# Patient Record
Sex: Female | Born: 1965 | Race: White | Hispanic: No | Marital: Married | Smoking: Current every day smoker
Health system: Southern US, Community
[De-identification: ages and names within clinical notes are randomized; demographics above are authoritative.]

## PROBLEM LIST (undated history)

## (undated) DIAGNOSIS — Z8719 Personal history of other diseases of the digestive system: Secondary | ICD-10-CM

## (undated) DIAGNOSIS — G4730 Sleep apnea, unspecified: Secondary | ICD-10-CM

## (undated) DIAGNOSIS — R5381 Other malaise: Secondary | ICD-10-CM

## (undated) DIAGNOSIS — E049 Nontoxic goiter, unspecified: Secondary | ICD-10-CM

## (undated) DIAGNOSIS — G43009 Migraine without aura, not intractable, without status migrainosus: Secondary | ICD-10-CM

## (undated) DIAGNOSIS — G4709 Other insomnia: Secondary | ICD-10-CM

## (undated) DIAGNOSIS — Z9989 Dependence on other enabling machines and devices: Secondary | ICD-10-CM

## (undated) DIAGNOSIS — R7301 Impaired fasting glucose: Secondary | ICD-10-CM

## (undated) DIAGNOSIS — J441 Chronic obstructive pulmonary disease with (acute) exacerbation: Secondary | ICD-10-CM

## (undated) DIAGNOSIS — F419 Anxiety disorder, unspecified: Secondary | ICD-10-CM

## (undated) DIAGNOSIS — I2510 Atherosclerotic heart disease of native coronary artery without angina pectoris: Secondary | ICD-10-CM

## (undated) DIAGNOSIS — K5904 Chronic idiopathic constipation: Secondary | ICD-10-CM

## (undated) DIAGNOSIS — Z681 Body mass index (BMI) 19 or less, adult: Secondary | ICD-10-CM

## (undated) DIAGNOSIS — I776 Arteritis, unspecified: Secondary | ICD-10-CM

## (undated) DIAGNOSIS — Z87442 Personal history of urinary calculi: Secondary | ICD-10-CM

## (undated) DIAGNOSIS — J449 Chronic obstructive pulmonary disease, unspecified: Secondary | ICD-10-CM

## (undated) DIAGNOSIS — I10 Essential (primary) hypertension: Secondary | ICD-10-CM

## (undated) DIAGNOSIS — G43909 Migraine, unspecified, not intractable, without status migrainosus: Secondary | ICD-10-CM

## (undated) DIAGNOSIS — R5383 Other fatigue: Secondary | ICD-10-CM

## (undated) DIAGNOSIS — R042 Hemoptysis: Secondary | ICD-10-CM

## (undated) DIAGNOSIS — E785 Hyperlipidemia, unspecified: Secondary | ICD-10-CM

## (undated) DIAGNOSIS — F410 Panic disorder [episodic paroxysmal anxiety] without agoraphobia: Secondary | ICD-10-CM

## (undated) DIAGNOSIS — J309 Allergic rhinitis, unspecified: Secondary | ICD-10-CM

## (undated) DIAGNOSIS — R300 Dysuria: Secondary | ICD-10-CM

## (undated) DIAGNOSIS — F329 Major depressive disorder, single episode, unspecified: Secondary | ICD-10-CM

## (undated) DIAGNOSIS — J42 Unspecified chronic bronchitis: Secondary | ICD-10-CM

## (undated) DIAGNOSIS — R110 Nausea: Secondary | ICD-10-CM

## (undated) DIAGNOSIS — B0223 Postherpetic polyneuropathy: Secondary | ICD-10-CM

## (undated) DIAGNOSIS — C8590 Non-Hodgkin lymphoma, unspecified, unspecified site: Secondary | ICD-10-CM

## (undated) DIAGNOSIS — R05 Cough: Secondary | ICD-10-CM

## (undated) DIAGNOSIS — K219 Gastro-esophageal reflux disease without esophagitis: Secondary | ICD-10-CM

## (undated) DIAGNOSIS — F17200 Nicotine dependence, unspecified, uncomplicated: Secondary | ICD-10-CM

## (undated) DIAGNOSIS — M353 Polymyalgia rheumatica: Secondary | ICD-10-CM

## (undated) DIAGNOSIS — G4733 Obstructive sleep apnea (adult) (pediatric): Secondary | ICD-10-CM

## (undated) HISTORY — DX: Panic disorder (episodic paroxysmal anxiety): F41.0

## (undated) HISTORY — DX: Other fatigue: R53.83

## (undated) HISTORY — PX: OTHER SURGICAL HISTORY: SHX169

## (undated) HISTORY — DX: Other malaise: R53.81

## (undated) HISTORY — DX: Chronic obstructive pulmonary disease, unspecified: J44.9

## (undated) HISTORY — DX: Major depressive disorder, single episode, unspecified: F32.9

## (undated) HISTORY — DX: Arteritis, unspecified: I77.6

## (undated) HISTORY — DX: Anxiety disorder, unspecified: F41.9

## (undated) HISTORY — DX: Cough: R05

## (undated) HISTORY — PX: ELBOW FRACTURE SURGERY: SHX616

## (undated) HISTORY — PX: ESOPHAGOGASTRODUODENOSCOPY: SHX1529

## (undated) HISTORY — DX: Dysuria: R30.0

## (undated) HISTORY — DX: Hemoptysis: R04.2

## (undated) HISTORY — DX: Postherpetic polyneuropathy: B02.23

## (undated) HISTORY — DX: Hyperlipidemia, unspecified: E78.5

## (undated) HISTORY — DX: Dependence on other enabling machines and devices: Z99.89

## (undated) HISTORY — PX: MOHS SURGERY: SHX181

## (undated) HISTORY — DX: Gastro-esophageal reflux disease without esophagitis: K21.9

## (undated) HISTORY — DX: Nontoxic goiter, unspecified: E04.9

## (undated) HISTORY — DX: Impaired fasting glucose: R73.01

## (undated) HISTORY — PX: AUGMENTATION MAMMAPLASTY: SUR837

## (undated) HISTORY — DX: Migraine without aura, not intractable, without status migrainosus: G43.009

## (undated) HISTORY — DX: Obstructive sleep apnea (adult) (pediatric): G47.33

## (undated) HISTORY — DX: Allergic rhinitis, unspecified: J30.9

## (undated) HISTORY — DX: Body mass index (BMI) 19.9 or less, adult: Z68.1

## (undated) HISTORY — DX: Nausea: R11.0

## (undated) HISTORY — DX: Chronic obstructive pulmonary disease with (acute) exacerbation: J44.1

## (undated) HISTORY — PX: FRACTURE SURGERY: SHX138

## (undated) HISTORY — PX: DILATION AND CURETTAGE OF UTERUS: SHX78

## (undated) HISTORY — DX: Chronic idiopathic constipation: K59.04

## (undated) HISTORY — DX: Sleep apnea, unspecified: G47.30

## (undated) HISTORY — DX: Non-Hodgkin lymphoma, unspecified, unspecified site: C85.90

## (undated) HISTORY — DX: Essential (primary) hypertension: I10

## (undated) HISTORY — DX: Other insomnia: G47.09

## (undated) HISTORY — DX: Nicotine dependence, unspecified, uncomplicated: F17.200

## (undated) HISTORY — DX: Polymyalgia rheumatica: M35.3

---

## 1986-04-06 HISTORY — PX: TUBAL LIGATION: SHX77

## 1996-04-06 HISTORY — PX: PLACEMENT OF BREAST IMPLANTS: SHX6334

## 1999-12-29 ENCOUNTER — Other Ambulatory Visit: Admission: RE | Admit: 1999-12-29 | Discharge: 1999-12-29 | Payer: Self-pay | Admitting: Obstetrics and Gynecology

## 1999-12-29 ENCOUNTER — Other Ambulatory Visit: Admission: RE | Admit: 1999-12-29 | Discharge: 1999-12-29 | Payer: Self-pay | Admitting: Physical Therapy

## 2000-05-11 ENCOUNTER — Observation Stay (HOSPITAL_COMMUNITY): Admission: RE | Admit: 2000-05-11 | Discharge: 2000-05-12 | Payer: Self-pay

## 2000-05-11 ENCOUNTER — Encounter (INDEPENDENT_AMBULATORY_CARE_PROVIDER_SITE_OTHER): Payer: Self-pay | Admitting: Specialist

## 2002-04-06 HISTORY — PX: VAGINAL HYSTERECTOMY: SUR661

## 2002-04-06 HISTORY — PX: INCONTINENCE SURGERY: SHX676

## 2007-04-18 ENCOUNTER — Encounter: Payer: Self-pay | Admitting: Family Medicine

## 2007-04-18 ENCOUNTER — Ambulatory Visit: Payer: Self-pay | Admitting: *Deleted

## 2007-04-18 DIAGNOSIS — F17200 Nicotine dependence, unspecified, uncomplicated: Secondary | ICD-10-CM | POA: Insufficient documentation

## 2007-04-18 DIAGNOSIS — F410 Panic disorder [episodic paroxysmal anxiety] without agoraphobia: Secondary | ICD-10-CM

## 2007-04-18 DIAGNOSIS — I10 Essential (primary) hypertension: Secondary | ICD-10-CM

## 2007-04-18 HISTORY — DX: Essential (primary) hypertension: I10

## 2007-04-18 HISTORY — DX: Panic disorder (episodic paroxysmal anxiety): F41.0

## 2007-04-18 HISTORY — DX: Nicotine dependence, unspecified, uncomplicated: F17.200

## 2007-05-20 ENCOUNTER — Ambulatory Visit: Payer: Self-pay | Admitting: Family Medicine

## 2007-05-20 LAB — CONVERTED CEMR LAB
Alkaline Phosphatase: 116 units/L (ref 39–117)
BUN: 14 mg/dL (ref 6–23)
CO2: 23 meq/L (ref 19–32)
Chloride: 102 meq/L (ref 96–112)
Cholesterol: 241 mg/dL — ABNORMAL HIGH (ref 0–200)
Glucose, Bld: 66 mg/dL — ABNORMAL LOW (ref 70–99)
Potassium: 4.5 meq/L (ref 3.5–5.3)
Total CHOL/HDL Ratio: 5.6
Total Protein: 7.6 g/dL (ref 6.0–8.3)
Triglycerides: 232 mg/dL — ABNORMAL HIGH (ref <150)
VLDL: 46 mg/dL — ABNORMAL HIGH (ref 0–40)

## 2007-05-22 ENCOUNTER — Encounter: Payer: Self-pay | Admitting: Family Medicine

## 2007-05-24 ENCOUNTER — Encounter: Payer: Self-pay | Admitting: Family Medicine

## 2007-05-24 DIAGNOSIS — J449 Chronic obstructive pulmonary disease, unspecified: Secondary | ICD-10-CM

## 2007-05-24 DIAGNOSIS — F32A Depression, unspecified: Secondary | ICD-10-CM

## 2007-05-24 DIAGNOSIS — F3289 Other specified depressive episodes: Secondary | ICD-10-CM

## 2007-05-24 DIAGNOSIS — J4489 Other specified chronic obstructive pulmonary disease: Secondary | ICD-10-CM

## 2007-05-24 DIAGNOSIS — F329 Major depressive disorder, single episode, unspecified: Secondary | ICD-10-CM | POA: Insufficient documentation

## 2007-05-24 DIAGNOSIS — G4730 Sleep apnea, unspecified: Secondary | ICD-10-CM

## 2007-05-24 HISTORY — DX: Depression, unspecified: F32.A

## 2007-05-24 HISTORY — DX: Major depressive disorder, single episode, unspecified: F32.9

## 2007-05-24 HISTORY — DX: Sleep apnea, unspecified: G47.30

## 2007-05-24 HISTORY — DX: Other specified chronic obstructive pulmonary disease: J44.89

## 2007-05-24 HISTORY — DX: Other specified depressive episodes: F32.89

## 2007-05-24 HISTORY — DX: Chronic obstructive pulmonary disease, unspecified: J44.9

## 2007-06-24 ENCOUNTER — Ambulatory Visit: Payer: Self-pay | Admitting: Family Medicine

## 2007-06-24 DIAGNOSIS — E785 Hyperlipidemia, unspecified: Secondary | ICD-10-CM | POA: Insufficient documentation

## 2007-06-24 HISTORY — DX: Hyperlipidemia, unspecified: E78.5

## 2007-08-17 ENCOUNTER — Encounter: Payer: Self-pay | Admitting: Family Medicine

## 2007-08-17 ENCOUNTER — Ambulatory Visit: Payer: Self-pay | Admitting: Family Medicine

## 2007-08-17 ENCOUNTER — Other Ambulatory Visit: Admission: RE | Admit: 2007-08-17 | Discharge: 2007-08-17 | Payer: Self-pay | Admitting: Family Medicine

## 2007-08-17 DIAGNOSIS — E049 Nontoxic goiter, unspecified: Secondary | ICD-10-CM

## 2007-08-17 HISTORY — DX: Nontoxic goiter, unspecified: E04.9

## 2007-08-19 LAB — CONVERTED CEMR LAB: TSH: 1.101 microintl units/mL (ref 0.350–5.50)

## 2007-08-24 ENCOUNTER — Encounter: Admission: RE | Admit: 2007-08-24 | Discharge: 2007-08-24 | Payer: Self-pay | Admitting: Family Medicine

## 2007-09-01 ENCOUNTER — Telehealth (INDEPENDENT_AMBULATORY_CARE_PROVIDER_SITE_OTHER): Payer: Self-pay | Admitting: *Deleted

## 2007-09-05 ENCOUNTER — Encounter: Payer: Self-pay | Admitting: Family Medicine

## 2007-09-05 ENCOUNTER — Encounter: Admission: RE | Admit: 2007-09-05 | Discharge: 2007-09-05 | Payer: Self-pay | Admitting: Family Medicine

## 2007-10-31 ENCOUNTER — Ambulatory Visit: Payer: Self-pay | Admitting: Family Medicine

## 2007-11-03 ENCOUNTER — Encounter: Payer: Self-pay | Admitting: Family Medicine

## 2007-11-03 ENCOUNTER — Encounter: Admission: RE | Admit: 2007-11-03 | Discharge: 2007-11-03 | Payer: Self-pay | Admitting: Family Medicine

## 2007-11-03 LAB — CONVERTED CEMR LAB
ALT: 13 units/L (ref 0–35)
AST: 21 units/L (ref 0–37)

## 2007-11-17 ENCOUNTER — Telehealth: Payer: Self-pay | Admitting: Family Medicine

## 2008-02-24 ENCOUNTER — Ambulatory Visit: Payer: Self-pay | Admitting: Family Medicine

## 2008-03-21 ENCOUNTER — Telehealth: Payer: Self-pay | Admitting: Family Medicine

## 2008-04-24 ENCOUNTER — Ambulatory Visit: Payer: Self-pay | Admitting: Family Medicine

## 2008-06-22 ENCOUNTER — Ambulatory Visit: Payer: Self-pay | Admitting: Family Medicine

## 2008-06-22 ENCOUNTER — Encounter: Admission: RE | Admit: 2008-06-22 | Discharge: 2008-06-22 | Payer: Self-pay | Admitting: Family Medicine

## 2008-07-13 ENCOUNTER — Ambulatory Visit: Payer: Self-pay | Admitting: Family Medicine

## 2008-08-17 ENCOUNTER — Ambulatory Visit: Payer: Self-pay | Admitting: Family Medicine

## 2008-08-17 DIAGNOSIS — G43009 Migraine without aura, not intractable, without status migrainosus: Secondary | ICD-10-CM

## 2008-08-17 HISTORY — DX: Migraine without aura, not intractable, without status migrainosus: G43.009

## 2008-08-22 ENCOUNTER — Telehealth: Payer: Self-pay | Admitting: Family Medicine

## 2008-09-04 ENCOUNTER — Telehealth: Payer: Self-pay | Admitting: Family Medicine

## 2008-09-21 ENCOUNTER — Ambulatory Visit: Payer: Self-pay | Admitting: Family Medicine

## 2008-12-03 ENCOUNTER — Encounter: Payer: Self-pay | Admitting: Family Medicine

## 2008-12-04 LAB — CONVERTED CEMR LAB
Albumin: 3.9 g/dL (ref 3.5–5.2)
Alkaline Phosphatase: 100 units/L (ref 39–117)
CO2: 19 meq/L (ref 19–32)
Chloride: 108 meq/L (ref 96–112)
Cholesterol: 161 mg/dL (ref 0–200)
Creatinine, Ser: 0.81 mg/dL (ref 0.40–1.20)
HDL: 38 mg/dL — ABNORMAL LOW (ref >39)
LDL Cholesterol: 59 mg/dL (ref 0–99)
Total Bilirubin: 0.2 mg/dL — ABNORMAL LOW (ref 0.3–1.2)
Total CHOL/HDL Ratio: 4.2
Total Protein: 6.4 g/dL (ref 6.0–8.3)

## 2008-12-26 ENCOUNTER — Telehealth: Payer: Self-pay | Admitting: Family Medicine

## 2008-12-27 ENCOUNTER — Telehealth (INDEPENDENT_AMBULATORY_CARE_PROVIDER_SITE_OTHER): Payer: Self-pay | Admitting: *Deleted

## 2009-01-15 ENCOUNTER — Ambulatory Visit: Payer: Self-pay | Admitting: Family Medicine

## 2009-01-31 ENCOUNTER — Emergency Department (HOSPITAL_COMMUNITY): Admission: EM | Admit: 2009-01-31 | Discharge: 2009-01-31 | Payer: Self-pay | Admitting: Emergency Medicine

## 2009-01-31 ENCOUNTER — Ambulatory Visit: Payer: Self-pay | Admitting: Family Medicine

## 2009-04-18 ENCOUNTER — Ambulatory Visit: Payer: Self-pay | Admitting: Family Medicine

## 2009-04-30 ENCOUNTER — Ambulatory Visit (HOSPITAL_COMMUNITY): Payer: Self-pay | Admitting: Psychology

## 2009-05-17 ENCOUNTER — Ambulatory Visit (HOSPITAL_COMMUNITY): Payer: Self-pay | Admitting: Psychology

## 2009-06-10 ENCOUNTER — Ambulatory Visit: Payer: Self-pay | Admitting: Family Medicine

## 2009-06-10 LAB — CONVERTED CEMR LAB
HCT: 40.6 % (ref 36.0–46.0)
Lymphocytes Relative: 26 % (ref 12–46)
MCHC: 33.1 g/dL (ref 30.0–36.0)
Monocytes Absolute: 0.5 10*3/uL (ref 0.1–1.0)
Neutro Abs: 5.4 10*3/uL (ref 1.7–7.7)
RBC: 4.06 M/uL (ref 3.87–5.11)
RDW: 12.5 % (ref 11.5–15.5)

## 2009-06-11 LAB — CONVERTED CEMR LAB
ALT: 9 units/L (ref 0–35)
AST: 9 units/L (ref 0–37)
Albumin: 4.1 g/dL (ref 3.5–5.2)
Alkaline Phosphatase: 83 units/L (ref 39–117)
Chloride: 111 meq/L (ref 96–112)
Total Protein: 6.6 g/dL (ref 6.0–8.3)

## 2009-09-17 ENCOUNTER — Ambulatory Visit: Payer: Self-pay | Admitting: Family Medicine

## 2009-09-17 DIAGNOSIS — R7301 Impaired fasting glucose: Secondary | ICD-10-CM | POA: Insufficient documentation

## 2009-09-17 HISTORY — DX: Impaired fasting glucose: R73.01

## 2009-10-17 ENCOUNTER — Ambulatory Visit: Payer: Self-pay | Admitting: Family Medicine

## 2009-10-17 DIAGNOSIS — J441 Chronic obstructive pulmonary disease with (acute) exacerbation: Secondary | ICD-10-CM | POA: Insufficient documentation

## 2009-10-17 HISTORY — DX: Chronic obstructive pulmonary disease with (acute) exacerbation: J44.1

## 2009-10-29 ENCOUNTER — Telehealth (INDEPENDENT_AMBULATORY_CARE_PROVIDER_SITE_OTHER): Payer: Self-pay | Admitting: *Deleted

## 2009-10-29 ENCOUNTER — Encounter: Admission: RE | Admit: 2009-10-29 | Discharge: 2009-10-29 | Payer: Self-pay | Admitting: Family Medicine

## 2009-10-29 ENCOUNTER — Ambulatory Visit: Payer: Self-pay | Admitting: Family Medicine

## 2009-11-05 ENCOUNTER — Ambulatory Visit: Payer: Self-pay | Admitting: Family Medicine

## 2009-11-05 DIAGNOSIS — J309 Allergic rhinitis, unspecified: Secondary | ICD-10-CM | POA: Insufficient documentation

## 2009-11-05 HISTORY — DX: Allergic rhinitis, unspecified: J30.9

## 2009-11-21 ENCOUNTER — Encounter: Payer: Self-pay | Admitting: Family Medicine

## 2009-12-10 ENCOUNTER — Telehealth: Payer: Self-pay | Admitting: Family Medicine

## 2010-01-06 ENCOUNTER — Ambulatory Visit: Payer: Self-pay | Admitting: Family Medicine

## 2010-01-06 DIAGNOSIS — R5381 Other malaise: Secondary | ICD-10-CM

## 2010-01-06 DIAGNOSIS — R5383 Other fatigue: Secondary | ICD-10-CM

## 2010-01-06 DIAGNOSIS — R300 Dysuria: Secondary | ICD-10-CM | POA: Insufficient documentation

## 2010-01-06 HISTORY — DX: Other malaise: R53.81

## 2010-01-06 HISTORY — DX: Dysuria: R30.0

## 2010-01-06 LAB — CONVERTED CEMR LAB
Blood in Urine, dipstick: NEGATIVE
Glucose, Urine, Semiquant: NEGATIVE
Nitrite: NEGATIVE
Protein, U semiquant: NEGATIVE
Specific Gravity, Urine: 1.01
Urobilinogen, UA: 0.2
WBC Urine, dipstick: NEGATIVE
pH: 6

## 2010-01-31 ENCOUNTER — Encounter: Payer: Self-pay | Admitting: Family Medicine

## 2010-02-01 LAB — CONVERTED CEMR LAB
Albumin: 4.4 g/dL (ref 3.5–5.2)
BUN: 9 mg/dL (ref 6–23)
Calcium: 9.9 mg/dL (ref 8.4–10.5)
Chloride: 103 meq/L (ref 96–112)
Creatinine, Ser: 0.81 mg/dL (ref 0.40–1.20)
HDL: 47 mg/dL (ref >39)
Hemoglobin: 15.1 g/dL — ABNORMAL HIGH (ref 12.0–15.0)
MCHC: 34 g/dL (ref 30.0–36.0)
MCV: 97.2 fL (ref 78.0–100.0)
Platelets: 239 10*3/uL (ref 150–400)
RDW: 12.7 % (ref 11.5–15.5)
Sodium: 138 meq/L (ref 135–145)
TSH: 1.177 microintl units/mL (ref 0.350–4.500)
Total Bilirubin: 0.3 mg/dL (ref 0.3–1.2)
WBC: 7.2 10*3/uL (ref 4.0–10.5)

## 2010-02-03 ENCOUNTER — Ambulatory Visit: Payer: Self-pay | Admitting: Family Medicine

## 2010-02-03 ENCOUNTER — Other Ambulatory Visit: Admission: RE | Admit: 2010-02-03 | Discharge: 2010-02-03 | Payer: Self-pay | Admitting: Family Medicine

## 2010-02-04 LAB — CONVERTED CEMR LAB: Pap Smear: NEGATIVE

## 2010-02-11 ENCOUNTER — Encounter: Payer: Self-pay | Admitting: Family Medicine

## 2010-02-17 ENCOUNTER — Encounter: Payer: Self-pay | Admitting: Family Medicine

## 2010-02-20 ENCOUNTER — Telehealth (INDEPENDENT_AMBULATORY_CARE_PROVIDER_SITE_OTHER): Payer: Self-pay | Admitting: *Deleted

## 2010-02-25 ENCOUNTER — Telehealth (INDEPENDENT_AMBULATORY_CARE_PROVIDER_SITE_OTHER): Payer: Self-pay | Admitting: *Deleted

## 2010-04-27 ENCOUNTER — Encounter: Payer: Self-pay | Admitting: Family Medicine

## 2010-05-06 NOTE — Assessment & Plan Note (Signed)
Summary: COPD exac   Vital Signs:  Patient profile:   45 year old female Height:      64.4 inches Weight:      153 pounds BMI:     26.03 O2 Sat:      99 % on Room air Temp:     98.4 degrees F oral Pulse rate:   78 / minute BP sitting:   116 / 76  (left arm) Cuff size:   regular  Vitals Entered By: Payton Spark CMA (October 17, 2009 10:23 AM)  O2 Flow:  Room air CC: Vomiting and diarrhea x 2 days. Also c/o runny nose and productive cough x 2 weeks.   Primary Care Provider:  Seymour Bars DO  CC:  Vomiting and diarrhea x 2 days. Also c/o runny nose and productive cough x 2 weeks.Marland Kitchen  History of Present Illness: 45 yo WF presents for a cold that started 2 wks ago.  She was taking Dayquil and Nyquil but just has not improved.  She started having epigastric pain with N/V 2 days ago.  She is having loose stools and is having low grade fevers.  Cough is productive of green sputum.  She is having SOB and chest tightness.  She has improved a little with Mucinex.  She has not been eating.  Drinking fluids and is able to keep them down.    She has no energy.  She had vomitting last night.   She has no appetite.    She is a long time smoker.  Has hx of mild  COPD.    Current Medications (verified): 1)  Alprazolam 0.25 Mg  Tabs (Alprazolam) .Marland Kitchen.. 1 Tab By Mouth Two Times A Day As Needed Anxiety 2)  Pravastatin Sodium 40 Mg Tabs (Pravastatin Sodium) .Marland Kitchen.. 1 Tab By Mouth Qhs 3)  Ventolin Hfa 108 (90 Base) Mcg/act Aers (Albuterol Sulfate) .... 2 Puffs Q 4-6 Hrs Prn 4)  Topamax 100 Mg Tabs (Topiramate) .... Take1 Tab By Mouth Bid 5)  Valtrex 500 Mg Tabs (Valacyclovir Hcl) .Marland Kitchen.. 1 Tab By Mouth Q 12 Hrs X 3 Days As Needed For Outbreaks 6)  Effexor Xr 150 Mg Xr24h-Cap (Venlafaxine Hcl) .Marland Kitchen.. 1 Capsule By Mouth Daily  Allergies (verified): 1)  ! Niacin  Past History:  Past Medical History: Reviewed history from 07/13/2008 and no changes required. Panic Attacks HTN  migraines dyslipidemia mild  COPD, PFTs 07-2008  [id number]  Social History: Reviewed history from 02/24/2008 and no changes required. 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   Smokes 1 ppd x 30 yrs.    Review of Systems      See HPI  Physical Exam  General:  alert, well-developed, well-nourished, and well-hydrated.   Head:  normocephalic and atraumatic.  sinuses NTTP Eyes:  conjunctiva clear; sclera non icteric Nose:  no nasal discharge.   Mouth:  pharynx pink and moist.   Neck:  no masses.   Lungs:  diffuse rhonchi, exp wheeze, RR of 16 bpm.  No accessory muscle use.  deep hacking cough Heart:  normal rate, regular rhythm, and no murmur.   Abdomen:  soft and non-tender.   Skin:  color normal.  no rash, pallor or jaundice Cervical Nodes:  No lymphadenopathy noted Psych:  good eye contact, not anxious appearing, and not depressed appearing.     Impression & Recommendations:  Problem # 1:  CHRONIC OBSTRUCTIVE PULMONARY DISEASE, ACUTE EXACERBATION (ICD-491.21)  COPD exacerbation with probable pneumonia (good pulse ox and nonlabored breathing --> held off on CXR). Treated with immediate improvement with a DUONEB in the office. Treat with Rocephin 1 gram IM + 5 days of Zithromax. Treat cough with Mucinex DM in the AM and RX Promethazine with codeine in the evening for both cough and nausea. Avoid smoking!  Use Albuterol HFA 4 x a day for the next wk. Call if any worsening in breathing or not seeing improvements by Monday. Add Prednisone 60 mg once a day x 5 days.  Orders: Nebulizer Tx ([address]) Albuterol up to 2.5mg  with Ipratropium (U0454) Rocephin  250mg  (U9811) Admin of Therapeutic Inj  intramuscular or subcutaneous (91478)  Complete Medication List: 1)  Alprazolam 0.25 Mg Tabs (Alprazolam) .Marland Kitchen.. 1 tab by mouth two times a day as needed anxiety 2)  Pravastatin Sodium  40 Mg Tabs (Pravastatin sodium) .Marland Kitchen.. 1 tab by mouth qhs 3)  Ventolin Hfa 108 (90 Base) Mcg/act Aers (Albuterol sulfate) .... 2 puffs q 4-6 hrs prn 4)  Topamax 100 Mg Tabs (Topiramate) .... Take1 tab by mouth bid 5)  Valtrex 500 Mg Tabs (Valacyclovir hcl) .Marland Kitchen.. 1 tab by mouth q 12 hrs x 3 days as needed for outbreaks 6)  Effexor Xr 150 Mg Xr24h-cap (Venlafaxine hcl) .Marland Kitchen.. 1 capsule by mouth daily 7)  Zithromax Z-pak 250 Mg Tabs (Azithromycin) .... 2 tabs by mouth x 1 day then 1 tab by mouth daily x 4 days 8)  Promethazine-codeine 6.25-10 Mg/19ml Syrp (Promethazine-codeine) .... 5 ml by mouth q 4-6 hrs as needed cough 9)  Prednisone 20 Mg Tabs (Prednisone) .... 3 tabs by mouth qam x 5 days  Patient Instructions: 1)  For clinical diagnosis of pneumonia, 2)  Treat with 1 gram rocephin injection today + 5 days of Zithromax. 3)  Nebulized treatment today. 4)  Use Albtuerol Inhaler 2 puffs 4 x a day for the next wk. 5)  Avoid smoking. 6)  Use RX cough medicine in the evenings and Mucinex DM in the AM. 7)  Call if not improved in the next 7 days.   Prescriptions: PREDNISONE 20 MG TABS (PREDNISONE) 3 tabs by mouth qAM x 5 days  #15 x 0   Entered and Authorized by:   Seymour Bars DO   Signed by:   Seymour Bars DO on 10/17/2009   Method used:   Electronically to        [facility] [phone]* (retail)       [address]       [place], [place]  [address]       Ph: [phone] or [phone]       Fax: [phone]   RxID:   [id number] PROMETHAZINE-CODEINE 6.25-10 MG/5ML SYRP (PROMETHAZINE-CODEINE) 5 ml by mouth q 4-6 hrs as needed cough  #180 ml x 0   Entered and Authorized by:   Seymour Bars DO   Signed by:   Seymour Bars DO on 10/17/2009   Method used:   Printed then faxed to ...       [facility] [phone]* (retail)       [address]       [place], [place]  [address]       Ph: [phone] or [phone]       Fax:  [phone]   RxID:  [id number] VENTOLIN HFA 108 (90 BASE) MCG/ACT AERS (ALBUTEROL SULFATE) 2 puffs q 4-6 hrs prn  #1 x 0   Entered and Authorized by:   Seymour Bars DO   Signed by:   Seymour Bars DO on 10/17/2009   Method used:   Electronically to        [facility] [phone]* (retail)       [address]       [place], [place]  [address]       Ph: [phone] or [phone]       Fax: [phone]   RxID:   [id number] ZITHROMAX Z-PAK 250 MG TABS (AZITHROMYCIN) 2 tabs by mouth x 1 day then 1 tab by mouth daily x 4 days  #1 pack x 0   Entered and Authorized by:   Seymour Bars DO   Signed by:   Seymour Bars DO on 10/17/2009   Method used:   Electronically to        [facility] [phone]* (retail)       [address]       [place], [place]  [address]       Ph: [phone] or [phone]       Fax: [phone]   RxID:   [id number]    Medication Administration  Injection # 1:    Medication: Rocephin  250mg     Diagnosis: CHRONIC OBSTRUCTIVE PULMONARY DISEASE, ACUTE EXACERBATION (ICD-491.21)    Route: IM    Site: LUOQ gluteus    Exp Date: 03/2012    Lot #: [id number]    Comments: 1 gram    Patient tolerated injection without complications    Given by: Payton Spark CMA (October 17, 2009 12:38 PM)  Orders Added: 1)  Est. Patient Level IV [06269] 2)  Nebulizer Tx [94640] 3)  Albuterol up to 2.5mg  with Ipratropium [J7620] 4)  Rocephin  250mg  [J0696] 5)  Admin of Therapeutic Inj  intramuscular or subcutaneous [48546]

## 2010-05-06 NOTE — Progress Notes (Signed)
Summary: bad cough  Phone Note Call from Patient   Caller: Patient Summary of Call: Dr.Bowen    Patient Call Back  [phone]                Pharmacy--[facility] in [place]  Patient said she still have a really bad cough and weezing alot--Not sure if she can get more meds called in or if she need another appt. Initial call taken by: Vanessa Swaziland,  October 29, 2009 9:34 AM  Follow-up for Phone Call        I printed out an order for a CXR to have done today. Check w/ pt to see if she has a nebulizer machine at home.   Follow-up by: Seymour Bars DO,  October 29, 2009 10:07 AM  Additional Follow-up for Phone Call Additional follow up Details #1::        LMOM for Pt to CB.  Pt coming in for apt. Additional Follow-up by: Payton Spark CMA,  October 29, 2009 10:21 AM

## 2010-05-06 NOTE — Assessment & Plan Note (Signed)
Summary: COPD exac -- continued   Vital Signs:  Patient profile:   45 year old female Height:      64.4 inches Weight:      153 pounds BMI:     26.03 O2 Sat:      98 % on Room air Temp:     98.3 degrees F oral Pulse rate:   90 / minute BP sitting:   118 / 73  (left arm) Cuff size:   regular  Vitals Entered By: Payton Spark CMA (October 29, 2009 2:38 PM)  O2 Flow:  Room air CC: ST x 5 days.   Primary Care Provider:  Seymour Bars DO  CC:  ST x 5 days.Marland Kitchen  History of Present Illness: 45 yo Rose [name] presents for f/u COPD exac.  She was treated for exacerbation on the 18th.  She finished her Prednisone and her Zithromax a few days ago.  She started feeling better but now feels worse again.  She has had a low grade fever.  She feels warn out.  She has chest tightness and SOB.  She has cut back on her smoking.  Use Albuterol HFA 3 x a day -- not giving much relief.  Her throat started hurting.  She is taking RX cough syrup at night and Mucinex DM during the day.    Current Medications (verified): 1)  Alprazolam 0.25 Mg  Tabs (Alprazolam) .Marland Kitchen.. 1 Tab By Mouth Two Times A Day As Needed Anxiety 2)  Pravastatin Sodium 40 Mg Tabs (Pravastatin Sodium) .Marland Kitchen.. 1 Tab By Mouth Qhs 3)  Ventolin Hfa 108 (90 Base) Mcg/act Aers (Albuterol Sulfate) .... 2 Puffs Q 4-6 Hrs Prn 4)  Topamax 100 Mg Tabs (Topiramate) .... Take1 Tab By Mouth Bid 5)  Valtrex 500 Mg Tabs (Valacyclovir Hcl) .Marland Kitchen.. 1 Tab By Mouth Q 12 Hrs X 3 Days As Needed For Outbreaks 6)  Effexor Xr 150 Mg Xr24h-Cap (Venlafaxine Hcl) .Marland Kitchen.. 1 Capsule By Mouth Daily  Allergies (verified): 1)  ! Niacin  Past History:  Past Medical History: Reviewed history from 07/13/2008 and no changes required. Panic Attacks HTN  migraines dyslipidemia mild COPD, PFTs 07-2008  [id number]  Social History: Reviewed history from 02/24/2008 and no changes required. 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   Smokes 1 ppd x 30 yrs.    Review of Systems      See HPI  Physical Exam  General:  alert, well-developed, well-nourished, and well-hydrated.   Head:  normocephalic and atraumatic.   Nose:  no nasal discharge.   Mouth:  pharynx pink and moist.  o/p injected Neck:  no masses.   Lungs:  dry hacking cough with rhonchi and wheezing.  nonlabored.  Improved aeration. Heart:  normal rate, regular rhythm, and no murmur.   Extremities:  no E/C/C Skin:  color normal.   Cervical Nodes:  No lymphadenopathy noted   Impression & Recommendations:  Problem # 1:  CHRONIC OBSTRUCTIVE PULMONARY DISEASE, ACUTE EXACERBATION (ICD-491.21) Improved a little bit but seems to be worsening off meds. Will get CXR to r/o CAP/ mass today.  Avoid smoking. Start Dulera 2 puffs two times a day.  Extend steroid taper for 9 more days. Use DUONEBS 4 x a day. Already did Rocephin + zithromax.  Will not retreat with abx unless infiltrate on CXR. Pulse ox stable.  Ok  to use Mucinex DM in the AM and RX cough syrup at night. Plan to update PFTS with pulm later this year. RTC in 1 wk for f/u. Orders: Home Health Referral (Home Health)  Complete Medication List: 1)  Alprazolam 0.25 Mg Tabs (Alprazolam) .Marland Kitchen.. 1 tab by mouth two times a day as needed anxiety 2)  Pravastatin Sodium 40 Mg Tabs (Pravastatin sodium) .Marland Kitchen.. 1 tab by mouth qhs 3)  Ventolin Hfa 108 (90 Base) Mcg/act Aers (Albuterol sulfate) .... 2 puffs q 4-6 hrs prn 4)  Topamax 100 Mg Tabs (Topiramate) .... Take1 tab by mouth bid 5)  Valtrex 500 Mg Tabs (Valacyclovir hcl) .Marland Kitchen.. 1 tab by mouth q 12 hrs x 3 days as needed for outbreaks 6)  Effexor Xr 150 Mg Xr24h-cap (Venlafaxine hcl) .Marland Kitchen.. 1 capsule by mouth daily 7)  Duoneb 0.5-2.5 (3) Mg/55ml Soln (Ipratropium-albuterol) .... 3 ml nebs 4 x a day x 10 days 8)  Dulera 100-5 Mcg/act Aero (Mometasone furo-formoterol fum) .... 2 puffs two  times a day (rinse mouth out after each use) 9)  Prednisone 20 Mg Tabs (Prednisone) .... 2 tabs by mouth qam x 3 days then 1 tab by mouth qam x 3 days then  1/2 tab by mouth qam x 3 days  Other Orders: Rapid Strep (82956)  Patient Instructions: 1)  For continued COPD exacerbation: 2)  Start Dulera 2 Puffs two times a day. 3)  Use DuoNebs 4 x a day for the next 10 days. 4)  Take Prednisone 40- 20-10 for another 9 days. 5)  Avoid smoking. 6)  Strep: NEG. 7)  CXR today. 8)  Will call you w/ CXR results tomorrow.  9)  Recheck in 7 days. Prescriptions: PREDNISONE 20 MG TABS (PREDNISONE) 2 tabs by mouth qAM x 3 days then 1 tab by mouth qAM x 3 days then  1/2 tab by mouth qAM x 3 days  #11 tabs x 0   Entered and Authorized by:   Seymour Bars DO   Signed by:   Seymour Bars DO on 10/29/2009   Method used:   Electronically to        [facility] [phone]* (retail)       [address]       [place], [place]  [address]       Ph: [phone] or [phone]       Fax: [phone]   RxID:   [id number] DUONEB 0.5-2.5 (3) MG/3ML SOLN (IPRATROPIUM-ALBUTEROL) 3 ml NEBS 4 x a day x 10 days  #1 box x 1   Entered and Authorized by:   Seymour Bars DO   Signed by:   Seymour Bars DO on 10/29/2009   Method used:   Electronically to        [facility] [phone]* (retail)       [address]       [place], [place]  [address]       Ph: [phone] or [phone]       Fax: [phone]   RxID:   [id number]   Laboratory Results    Other Tests  Rapid Strep: negative

## 2010-05-06 NOTE — Progress Notes (Signed)
Summary: stress echo: normal  Phone Note Outgoing Call   Summary of Call: Pls let pt know that her stress echo came back normal.   Initial call taken by: Seymour Bars DO,  February 20, 2010 12:47 PM  Follow-up for Phone Call        [facility] informing Pt of the above Follow-up by: Payton Spark CMA,  February 21, 2010 8:51 AM

## 2010-05-06 NOTE — Assessment & Plan Note (Signed)
Summary: bronchitis   Vital Signs:  Patient profile:   45 year old female Height:      64.4 inches Weight:      162 pounds BMI:     27.56 O2 Sat:      100 % on Room air Temp:     98.1 degrees F oral Pulse rate:   70 / minute BP sitting:   120 / 77  (left arm) Cuff size:   regular  Vitals Entered By: Payton Spark CMA (April 18, 2009 11:22 AM)  O2 Flow:  Room air CC: Sinus infection x 1 week   Primary Care Provider:  Seymour Bars DO  CC:  Sinus infection x 1 week.  History of Present Illness: 45 yo WF presents for 1 wks of head congestion and sore throat.  She has had subjective fevers. She has some fatigue.  No bodyaches.  She has frontal sinus pressure.  She has yellow green rhinorrhea.  She has started coughing.  She is still smoking but has cut down to 1/2 ppd.  No chest tightness or SOB.  She tried taking Alavert.  She is taking some Ibuprofen.  Poor appetite.  No rash or GI upset.  No sick contacts.  She'd like to retry Chantix and she tells me that she restarted herself on Effexor for depression and anxiety that seems to be worse.  She does not have a good support system and is having some marrital stressors.  Current Medications (verified): 1)  Alprazolam 0.25 Mg  Tabs (Alprazolam) .Marland Kitchen.. 1 Tab By Mouth Two Times A Day As Needed Anxiety 2)  Pravastatin Sodium 40 Mg Tabs (Pravastatin Sodium) .Marland Kitchen.. 1 Tab By Mouth Qhs 3)  Ventolin Hfa 108 (90 Base) Mcg/act Aers (Albuterol Sulfate) .... 2 Puffs Q 4-6 Hrs Prn 4)  Topamax 100 Mg Tabs (Topiramate) .... 1/2 Tab By Mouth Qam and 1 Tab By Mouth Q Pm X 1 Wk Then 1 Tab By Mouth Bid 5)  Valtrex 500 Mg Tabs (Valacyclovir Hcl) .Marland Kitchen.. 1 Tab By Mouth Q 12 Hrs X 3 Days As Needed For Outbreaks  Allergies (verified): 1)  ! Niacin  Past History:  Past Medical History: Reviewed history from 07/13/2008 and no changes required. Panic Attacks HTN  migraines dyslipidemia mild COPD, PFTs 07-2008  [id number]  Past Surgical History: Reviewed  history from 08/17/2007 and no changes required. partial hysterectomy for endometriosis saline breast implants 1994  Social History: Reviewed history from 02/24/2008 and no changes required. 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   Smokes 1 ppd x 30 yrs.    Review of Systems Psych:  Complains of anxiety, depression, easily tearful, irritability, and panic attacks; denies alternate hallucination ( auditory/visual), suicidal thoughts/plans, thoughts of violence, unusual visions or sounds, and thoughts /plans of harming others.  Physical Exam  General:  alert, well-developed, well-nourished, well-hydrated, and overweight-appearing.   Head:  normocephalic and atraumatic.  frontal sinus TTP Eyes:  conjunctiva clear; wears glasses Ears:  EACs patent; TMs translucent and gray with good cone of light and bony landmarks.  Nose:  septal deviation to the R nasal congesitonpresent Mouth:  cobblestoning, injection, hoarsenss Neck:  no masses.   Lungs:  Normal respiratory effort, chest expands symmetrically. Lungs are clear to auscultation, no crackles or wheezes. dry hacking cough Heart:  Normal rate and regular rhythm. S1 and S2  normal without gallop, murmur, click, rub or other extra sounds. Abdomen:  soft and non-tender.   Extremities:  no LE edema Skin:  color normal.   Cervical Nodes:  No lymphadenopathy noted   Impression & Recommendations:  Problem # 1:  ACUTE FRONTAL SINUSITIS (ICD-461.1) Cover with Augmentin x 10 days.  AVoid decongestants with HTN. OK to use Mucinex DM. Call if not improved in 10 days. The following medications were removed from the medication list:    Hydrocodone-homatropine 5-1.5 Mg/48ml Syrp (Hydrocodone-homatropine) .Marland KitchenMarland KitchenMarland KitchenMarland Kitchen 5 ml by mouth at bedtime as needed cough Her updated medication list for this problem includes:     Augmentin 875-125 Mg Tabs (Amoxicillin-pot clavulanate) .Marland Kitchen... 1 tab by mouth two times a day x 10 days, take with food  Problem # 2:  COPD (ICD-496) No sign of acute exacerbation of her COPD and she is trying to quit smoking.  No wheezing on exam but she has a very deep dry hacking cough.  I have filled her a Combivent HFA to use 4 x a day for 10 days.  Will need PFTs this year. Her updated medication list for this problem includes:    Ventolin Hfa 108 (90 Base) Mcg/act Aers (Albuterol sulfate) .Marland Kitchen... 2 puffs q 4-6 hrs prn    Combivent 103-18 Mcg/act Aero (Ipratropium-albuterol) .Marland Kitchen... 2 puffs 4 x a day x 10 days  Problem # 3:  DEPRESSION (ICD-311) Assessment: Deteriorated I advised her to stay on these meds and scheduled her a counseling appt.  RTC for f/u in 3 mos. Her updated medication list for this problem includes:    Alprazolam 0.25 Mg Tabs (Alprazolam) .Marland Kitchen... 1 tab by mouth two times a day as needed anxiety    Effexor Xr 150 Mg Xr24h-cap (Venlafaxine hcl) .Marland Kitchen... 1 capsule by mouth daily  Orders: Psychology Referral (Psychology)  Complete Medication List: 1)  Alprazolam 0.25 Mg Tabs (Alprazolam) .Marland Kitchen.. 1 tab by mouth two times a day as needed anxiety 2)  Pravastatin Sodium 40 Mg Tabs (Pravastatin sodium) .Marland Kitchen.. 1 tab by mouth qhs 3)  Ventolin Hfa 108 (90 Base) Mcg/act Aers (Albuterol sulfate) .... 2 puffs q 4-6 hrs prn 4)  Topamax 100 Mg Tabs (Topiramate) .... 1/2 tab by mouth qam and 1 tab by mouth q pm x 1 wk then 1 tab by mouth bid 5)  Valtrex 500 Mg Tabs (Valacyclovir hcl) .Marland Kitchen.. 1 tab by mouth q 12 hrs x 3 days as needed for outbreaks 6)  Effexor Xr 150 Mg Xr24h-cap (Venlafaxine hcl) .Marland Kitchen.. 1 capsule by mouth daily 7)  Augmentin 875-125 Mg Tabs (Amoxicillin-pot clavulanate) .Marland Kitchen.. 1 tab by mouth two times a day x 10 days, take with food 8)  Chantix Starting Month Pak 0.5 Mg X 11 & 1 Mg X 42 Tabs (Varenicline tartrate) .... Take as directed 9)  Combivent 103-18 Mcg/act Aero  (Ipratropium-albuterol) .... 2 puffs 4 x a day x 10 days  Patient Instructions: 1)  Take 10 days of Augmentin. 2)  Avoid smoking. 3)  Take OTC Mucinex DM for cough. 4)  Use Combivent HFA 4 x a day for the next 10 days to help with hacking cough. 5)  If not improving in 1 wk, please call.   Prescriptions: COMBIVENT 103-18 MCG/ACT AERO (IPRATROPIUM-ALBUTEROL) 2 puffs 4 x a day x 10 days  #1 x 0   Entered and Authorized by:   Seymour Bars DO   Signed by:   Seymour Bars DO on 04/18/2009   Method used:  Electronically to        [facility] [phone]* (retail)       [address]       [place], [place]  [address]       Ph: [phone] or [phone]       Fax: [phone]   RxID:   [id number] CHANTIX STARTING MONTH PAK 0.5 MG X 11 & 1 MG X 42 TABS (VARENICLINE TARTRATE) take as directed  #1 pack x 0   Entered and Authorized by:   Seymour Bars DO   Signed by:   Seymour Bars DO on 04/18/2009   Method used:   Electronically to        [facility] [phone]* (retail)       [address]       [place], [place]  [address]       Ph: [phone] or [phone]       Fax: [phone]   RxID:   [id number] AUGMENTIN 875-125 MG TABS (AMOXICILLIN-POT CLAVULANATE) 1 tab by mouth two times a day x 10 days, take with food  #20 x 0   Entered and Authorized by:   Seymour Bars DO   Signed by:   Seymour Bars DO on 04/18/2009   Method used:   Electronically to        [facility] [phone]* (retail)       [address]       [place], [place]  [address]       Ph: [phone] or [phone]       Fax: [phone]   RxID:   [id number]

## 2010-05-06 NOTE — Progress Notes (Signed)
Summary: ALPRAZOLAM REFILL--UPDATE  Phone Note Refill Request Message from:  Fax from Pharmacy  Refills Requested: Medication #1:  ALPRAZOLAM 0.25 MG  TABS 1 tab by mouth two times a day as needed anxiety Rushie Chestnut [address] [place], [place]      [place] [phone]  Initial call taken by: Jerolyn Shin,  December 10, 2009 10:00 AM  Follow-up for Phone Call        Last filled 09-17-09. Please advise. Lucious Groves CMA  December 10, 2009 10:10 AM   Additional Follow-up for Phone Call Additional follow up Details #1::        pt is not my pt- she is Dr Ovidio Kin pt.  refill should be forwarded to her Additional Follow-up by: Neena Rhymes MD,  December 10, 2009 10:13 AM    Additional Follow-up for Phone Call Additional follow up Details #2::    COPY OF [facility] FAX HAS ALREADY BEEN SENT TO [place] Follow-up by: Jerolyn Shin,  December 10, 2009 10:14 AM

## 2010-05-06 NOTE — Assessment & Plan Note (Signed)
Summary: f/u COPD   Vital Signs:  Patient profile:   45 year old female Height:      64.4 inches Weight:      156 pounds BMI:     26.54 O2 Sat:      99 % on Room air Pulse rate:   76 / minute BP sitting:   134 / 83  (left arm) Cuff size:   regular  Vitals Entered By: Payton Spark CMA (November 05, 2009 8:45 AM)  O2 Flow:  Room air CC: 1 wk f/u. Doing better.    Primary Care Provider:  Seymour Bars DO  CC:  1 wk f/u. Doing better. Marland Kitchen  History of Present Illness: 45 yo WF presents for f/u COPD exacerbation.  She has improved. She is still coughing but not as much.  She started Chantix yesterday and wants to quit smoking. She has 1 more day of her extended prednisone taper.Her CXR was c/w COPD changes.  Initially, she was treated iwth Rocephin and Zithromax.  She is now on Dulera 2 x day and using DuoNeb 4 x a day which both have really helped.  She seems to have AR in the late summer and late winter each year. Having more postnasal drip.  now on Zyrtec qPM.   Current Medications (verified): 1)  Alprazolam 0.25 Mg  Tabs (Alprazolam) .Marland Kitchen.. 1 Tab By Mouth Two Times A Day As Needed Anxiety 2)  Pravastatin Sodium 40 Mg Tabs (Pravastatin Sodium) .Marland Kitchen.. 1 Tab By Mouth Qhs 3)  Ventolin Hfa 108 (90 Base) Mcg/act Aers (Albuterol Sulfate) .... 2 Puffs Q 4-6 Hrs Prn 4)  Topamax 100 Mg Tabs (Topiramate) .... Take1 Tab By Mouth Bid 5)  Valtrex 500 Mg Tabs (Valacyclovir Hcl) .Marland Kitchen.. 1 Tab By Mouth Q 12 Hrs X 3 Days As Needed For Outbreaks 6)  Effexor Xr 150 Mg Xr24h-Cap (Venlafaxine Hcl) .Marland Kitchen.. 1 Capsule By Mouth Daily 7)  Duoneb 0.5-2.5 (3) Mg/41ml Soln (Ipratropium-Albuterol) .... 3 Ml Nebs 4 X A Day X 10 Days 8)  Dulera 100-5 Mcg/act Aero (Mometasone Furo-Formoterol Fum) .... 2 Puffs Two Times A Day (Rinse Mouth Out After Each Use) 9)  Prednisone 20 Mg Tabs (Prednisone) .... 2 Tabs By Mouth Qam X 3 Days Then 1 Tab By Mouth Qam X 3 Days Then  1/2 Tab By Mouth Qam X 3 Days  Allergies (verified): 1)  !  Niacin  Past History:  Past Medical History: Reviewed history from 07/13/2008 and no changes required. Panic Attacks HTN  migraines dyslipidemia mild COPD, PFTs 07-2008  [id number]  Social History: Reviewed history from 02/24/2008 and no changes required. 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   Smokes 1 ppd x 30 yrs.    Review of Systems      See HPI  Physical Exam  General:  alert, well-developed, well-nourished, well-hydrated, and overweight-appearing.   Head:  normocephalic and atraumatic.   Eyes:  conjunctiva clear; sclera non icteric Nose:  no nasal discharge.   Mouth:  pharynx pink and moist.   Neck:  no masses.   Lungs:  normal respiratory effort, no intercostal retractions, no accessory muscle use, normal breath sounds, no crackles, and no wheezes.   Heart:  normal rate, regular rhythm, and no murmur.   Extremities:  no E/C/C Skin:  color normal.   Cervical Nodes:  No  lymphadenopathy noted Psych:  good eye contact, not anxious appearing, and not depressed appearing.     Impression & Recommendations:  Problem # 1:  CHRONIC OBSTRUCTIVE PULMONARY DISEASE, ACUTE EXACERBATION (ICD-491.21) Assessment Improved Finish out last day of Prednisone taper.  Stay on Dulera 2 x a day.  Can decrease frequency of DuoNebs from 4 x a day to 2x a day.  Work on smoking cessation and treat underlying allergies with Zyrtec.    Refer to allergist for testing and PFTs.    Complete Medication List: 1)  Alprazolam 0.25 Mg Tabs (Alprazolam) .Marland Kitchen.. 1 tab by mouth two times a day as needed anxiety 2)  Pravastatin Sodium 40 Mg Tabs (Pravastatin sodium) .Marland Kitchen.. 1 tab by mouth qhs 3)  Ventolin Hfa 108 (90 Base) Mcg/act Aers (Albuterol sulfate) .... 2 puffs q 4-6 hrs prn 4)  Topamax 100 Mg Tabs (Topiramate) .... Take1 tab by mouth bid 5)  Valtrex 500 Mg Tabs  (Valacyclovir hcl) .Marland Kitchen.. 1 tab by mouth q 12 hrs x 3 days as needed for outbreaks 6)  Effexor Xr 150 Mg Xr24h-cap (Venlafaxine hcl) .Marland Kitchen.. 1 capsule by mouth daily 7)  Duoneb 0.5-2.5 (3) Mg/84ml Soln (Ipratropium-albuterol) .... 3 ml nebs 2-3  x a day as needed 8)  Dulera 100-5 Mcg/act Aero (Mometasone furo-formoterol fum) .... 2 puffs two times a day (rinse mouth out after each use) 9)  Cetirizine Hcl 10 Mg Tabs (Cetirizine hcl) .Marland Kitchen.. 1 tab by mouth qpm  Other Orders: Allergy Referral  (Allergy)  Patient Instructions: 1)  Finish out Prednisone taper. 2)  Cut back on DuoNeb to 2 x a day. 3)  Stay on Dulera 2 x a day until follow up visit. 4)  Take Cefirizine every evening for seasonal allergies. 5)  Work on smoking cessation. 6)  Will set you up with allergist for testing/ PFTs. 7)  REturn for f/u COPD/ mood/ flu shot in 2 mos.

## 2010-05-06 NOTE — Assessment & Plan Note (Signed)
Summary: Epigastric pain, - Dr. Cleotis Nipper   Vital Signs:  Patient profile:   45 year old female Height:      64.4 inches Weight:      160 pounds Temp:     98.5 degrees F oral Pulse rate:   80 / minute BP sitting:   132 / 77  (left arm) Cuff size:   regular  Vitals Entered By: Kathlene November (June 10, 2009 10:04 AM) CC: epigastric pain started on Saturday then last night vomited BRB   Primary Care Provider:  Seymour Bars DO  CC:  epigastric pain started on Saturday then last night vomited BRB.  History of Present Illness: Rose [name] is a 45 year old female who had epigastric pain that started Saturday morning. She had not eaten anything that morning. The pain was sharp and stabbing and remained constant over the weekend. It is tender to the touch and bending exacerbates it. Tylenol has not helped. She has felt bloated. Last night she had pizza for dinner and vomited about 20 minutes later. She saw blood mixed in the vomitus. Has felt nauseous but has not vomited since then. Has had normal bowel movements with no blood in the stool. No black tarry stools. Hysterectomy in 2000. No hx of ulcers. No recent reflux sxs. Had flu 2 weeks ago. No change in her bowels. Didn't try any acid reflux medications.  Still has gallbaldder.   Current Medications (verified): 1)  Alprazolam 0.25 Mg  Tabs (Alprazolam) .Marland Kitchen.. 1 Tab By Mouth Two Times A Day As Needed Anxiety 2)  Pravastatin Sodium 40 Mg Tabs (Pravastatin Sodium) .Marland Kitchen.. 1 Tab By Mouth Qhs 3)  Ventolin Hfa 108 (90 Base) Mcg/act Aers (Albuterol Sulfate) .... 2 Puffs Q 4-6 Hrs Prn 4)  Topamax 100 Mg Tabs (Topiramate) .... 1/2 Tab By Mouth Qam and 1 Tab By Mouth Q Pm X 1 Wk Then 1 Tab By Mouth Bid 5)  Valtrex 500 Mg Tabs (Valacyclovir Hcl) .Marland Kitchen.. 1 Tab By Mouth Q 12 Hrs X 3 Days As Needed For Outbreaks 6)  Effexor Xr 150 Mg Xr24h-Cap (Venlafaxine Hcl) .Marland Kitchen.. 1 Capsule By Mouth Daily 7)  Combivent 103-18 Mcg/act Aero (Ipratropium-Albuterol) .... 2 Puffs 4 X A Day X  10 Days  Allergies (verified): 1)  ! Niacin  Comments:  Nurse/Medical Assistant: The patient's medications and allergies were reviewed with the patient and were updated in the Medication and Allergy Lists. 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   Smokes 1 ppd x 30 yrs.    Family History: Reviewed history from 10/31/2007 and no changes required. Cancer: dad (prostate), mother (breast) MI: dad Hypercholesterol: dad, mom, brothers HTN: dad, brothers sister AMI at 23  Physical Exam  General:  Overweight-appearing female in no acute distress.  Head:  Normocephalic and atraumatic.  Eyes:  Sclera clear.  Mouth:  Oral mucosa and oropharynx without lesions or exudates.  Teeth in good repair. Lungs:  Clear to auscultation bilaterally with no wheezes, rales, or rhonchi. Normal work of breathing.  Heart:  RRR, normal S1 and S2. No murmurs, rubs, or gallops.  Abdomen:  Soft. Diffusely tender to deep palpation, marked epigastric tenderness. No  guarding or rebound. Normoactive bowel sounds. no hepatomegaly and no splenomegaly.   Pulses:  2+ radial pulses bilaterally.  Extremities:  No cyanosis, clubbing, or edema.  Skin:  no rashes.   Cervical Nodes:  No lymphadenopathy noted Psych:  Interacting apppropriately with normal concentration and attention.    Impression & Recommendations:  Problem # 1:  EPIGASTRIC PAIN (ICD-789.06) Peptic ulcer vs. pancreatitis vs. cholelithiasis/cholangitis. GI cocktail did not produce symptomatic relief. Wil begin daily Dexilant. Will check CBC, CMP, amylase/lipase. If negative and symptoms remain with  Dexilant, will order abdominal u/s. Instructed pt to d/c any NSAIDs and to present to ED if she vomits again with profuse blood. Note, she is a smoker.   Call with [phone] (cell).  Orders: T-Comprehensive Metabolic Panel [phone]) T-CBC w/Diff [phone]) T-Amylase [id number]) T-Lipase ([id number])  Complete Medication List: 1)  Alprazolam 0.25 Mg Tabs (Alprazolam) .Marland Kitchen.. 1 tab by mouth two times a day as needed anxiety 2)  Pravastatin Sodium 40 Mg Tabs (Pravastatin sodium) .Marland Kitchen.. 1 tab by mouth qhs 3)  Ventolin Hfa 108 (90 Base) Mcg/act Aers (Albuterol sulfate) .... 2 puffs q 4-6 hrs prn 4)  Topamax 100 Mg Tabs (Topiramate) .... 1/2 tab by mouth qam and 1 tab by mouth q pm x 1 wk then 1 tab by mouth bid 5)  Valtrex 500 Mg Tabs (Valacyclovir hcl) .Marland Kitchen.. 1 tab by mouth q 12 hrs x 3 days as needed for outbreaks 6)  Effexor Xr 150 Mg Xr24h-cap (Venlafaxine hcl) .Marland Kitchen.. 1 capsule by mouth daily 7)  Combivent 103-18 Mcg/act Aero (Ipratropium-albuterol) .... 2 puffs 4 x a day x 10 days 8)  Promethazine Hcl 25 Mg Tabs (Promethazine hcl) .... One by mouth every 6 hours as needed nausea. Prescriptions: PROMETHAZINE HCL 25 MG TABS (PROMETHAZINE HCL) one by mouth every 6 hours as needed nausea.  #8 x 0   Entered and Authorized by:   Nani Gasser MD   Signed by:   Nani Gasser MD on 06/10/2009   Method used:   Electronically to        [facility]. [phone]* (retail)       [address]       [place], [place]  [address]       Ph: [phone] or [phone]       Fax: [phone]   RxID:   [id number]

## 2010-05-06 NOTE — Assessment & Plan Note (Signed)
Summary: f/u COPD / BP   Vital Signs:  Patient profile:   45 year old female Height:      64.4 inches Weight:      157 pounds BMI:     26.71 O2 Sat:      100 % on Room air Pulse rate:   65 / minute BP sitting:   151 / 81  (left arm) Cuff size:   regular  Vitals Entered By: Payton Spark CMA (January 06, 2010 9:18 AM)  O2 Flow:  Room air CC: F/U. Also c/o ? UTI x 2 weeks.    Primary Care Provider:  Seymour Bars DO  CC:  F/U. Also c/o ? UTI x 2 weeks. Marland Kitchen  History of Present Illness: 45 yo WF presents for f/u COPD and allergies.  She saw Dr Mikey Bussing in Aug and tested + for mold allergies.  She is down to 1/2 ppd from 1 ppd with her smoking and had SEs from Chantix and using Nicorette gum.  She started her allergy shots last month.  She is also on Xyzal and Nasonex.  She is still not using her Dulera on a scheduled basis and is using Combivent about once a wk.    She feels tired all the time and this has been going on for month.  She had a hysterectomy w/o oophorectomy.  She does wear a CPAP and has not had it checked in years.  Feels tired in the AM when she gets up.  Her last sleep study was 3 yrs ago.    She has had 'strong smell' to her urine but no other voiding symptoms.    She is off her micardis/hctz and her BPs have started running higher.    Current Medications (verified): 1)  Alprazolam 0.25 Mg  Tabs (Alprazolam) .Marland Kitchen.. 1 Tab By Mouth Two Times A Day As Needed Anxiety 2)  Pravastatin Sodium 40 Mg Tabs (Pravastatin Sodium) .Marland Kitchen.. 1 Tab By Mouth Qhs 3)  Ventolin Hfa 108 (90 Base) Mcg/act Aers (Albuterol Sulfate) .... 2 Puffs Q 4-6 Hrs Prn 4)  Topamax 100 Mg Tabs (Topiramate) .... Take1 Tab By Mouth Bid 5)  Valtrex 500 Mg Tabs (Valacyclovir Hcl) .Marland Kitchen.. 1 Tab By Mouth Q 12 Hrs X 3 Days As Needed For Outbreaks 6)  Effexor Xr 150 Mg Xr24h-Cap (Venlafaxine Hcl) .Marland Kitchen.. 1 Capsule By Mouth Daily 7)  Duoneb 0.5-2.5 (3) Mg/61ml Soln (Ipratropium-Albuterol) .... 3 Ml Nebs 2-3  X A Day As  Needed 8)  Cetirizine Hcl 10 Mg Tabs (Cetirizine Hcl) .Marland Kitchen.. 1 Tab By Mouth Qpm  Allergies (verified): 1)  ! Niacin  Past History:  Past Medical History: Panic Attacks HTN  migraines dyslipidemia mild COPD, PFTs at [facility] 11-2009 Mold allergies, on immunotherapy with Dr Mikey Bussing [facility], on CPAP  [id number]  Past Surgical History: Reviewed history from 08/17/2007 and no changes required. partial hysterectomy for endometriosis saline breast implants 1994  Family History: Reviewed history from 10/31/2007 and no changes required. Cancer: dad (prostate), mother (breast) MI: dad Hypercholesterol: dad, mom, brothers HTN: dad, brothers sister AMI at 46  Social History: Reviewed history from 02/24/2008 and no changes required. 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   Smokes 1 ppd x 30 yrs.    Review of Systems      See HPI  Physical Exam  General:  alert, well-developed, well-nourished, well-hydrated, and overweight-appearing.   Head:  normocephalic and atraumatic.   Eyes:  conjunctiva clear; wears glasses Nose:  no nasal discharge.   Mouth:  pharynx pink and moist.   Neck:  no masses.   Lungs:  normal respiratory effort, no intercostal retractions, no accessory muscle use, normal breath sounds, no crackles, and no wheezes.   Heart:  normal rate, regular rhythm, and no murmur.   Extremities:  no LE edema Skin:  color normal.   Cervical Nodes:  No lymphadenopathy noted Psych:  good eye contact, not anxious appearing, and not depressed appearing.     Impression & Recommendations:  Problem # 1:  COPD (ICD-496) Stable.  Reminded pt to use Dulera 2 x a day everyday as maintnenance COPD treatment and save Combivent for rescue use.  Continue to work on smoking cessation, down 50% thus far.  Counseled x 3 min on smoking cessation.  Flu  vaccine today.  Had PFTS with Dr Mikey Bussing Aug 2011. The following medications were removed from the medication list:    Dulera 100-5 Mcg/act Aero (Mometasone furo-formoterol fum) .Marland Kitchen... 2 puffs two times a day (rinse mouth out after each use) Her updated medication list for this problem includes:    Ventolin Hfa 108 (90 Base) Mcg/act Aers (Albuterol sulfate) .Marland Kitchen... 2 puffs q 4-6 hrs prn    Duoneb 0.5-2.5 (3) Mg/16ml Soln (Ipratropium-albuterol) .Marland KitchenMarland KitchenMarland KitchenMarland Kitchen 3 ml nebs 2-3  x a day as needed  Problem # 2:  SLEEP APNEA (ICD-780.57) Last sleep study 3 yrs ago.  She is c/o daytime fatigue for months.  Will start by updating her sleep study since she's been on CPAP > 3 yrs and has not had it rechecked. Orders: Sleep Study (Sleep Study)  Problem # 3:  FATIGUE (ICD-780.79) Likely due to her sleep apnea but will check a CBC with her fasting labs.   Orders: T-CBC No Diff ([id number])  Problem # 4:  HYPERTENSION, BENIGN ESSENTIAL (ICD-401.1) BP high today and was high at [facility].  Instead of micardis/hctz, will try her on plain HCTZ with goals of <140/90.  Update labs.  Recheck at CPE in 3 wks. Her updated medication list for this problem includes:    Hydrochlorothiazide 25 Mg Tabs (Hydrochlorothiazide) .Marland Kitchen... 1/2 tab by mouth qam  Orders: T-Comprehensive Metabolic Panel ([id number])  BP today: 151/81 Prior BP: 134/83 (11/05/2009)  Labs Reviewed: K+: 4.4 (06/10/2009) Creat: : 0.64 (06/10/2009)   Chol: 161 (12/03/2008)   HDL: 38 (12/03/2008)   LDL: 59 (12/03/2008)   TG: 322 (12/03/2008)  Problem # 5:  DYSURIA (ICD-788.1) Had vague urinary symptoms with ? bloody discharge 2 wks ago.  UA is completely normal today.  She has had a TAH and her last Pap was 5-09.  RTC for pap/ pelvic in 3 wks.  Will need to make sure her cardiac testing is UTD. Orders: UA Dipstick w/o Micro (automated)  ([id number])  Complete Medication List: 1)  Alprazolam 0.25 Mg Tabs (Alprazolam) .Marland Kitchen.. 1 tab by mouth two times a day  as needed anxiety 2)  Pravastatin Sodium 40 Mg Tabs (Pravastatin sodium) .Marland Kitchen.. 1 tab by mouth qhs 3)  Ventolin Hfa 108 (90 Base) Mcg/act Aers (Albuterol sulfate) .... 2 puffs q 4-6 hrs prn 4)  Topamax 100 Mg Tabs (Topiramate) .... Take1 tab by mouth bid 5)  Valtrex 500 Mg Tabs (Valacyclovir hcl) .Marland Kitchen.. 1 tab by mouth q 12 hrs x 3 days as needed for outbreaks 6)  Effexor Xr 150 Mg Xr24h-cap (  Venlafaxine hcl) .Marland Kitchen.. 1 capsule by mouth daily 7)  Duoneb 0.5-2.5 (3) Mg/65ml Soln (Ipratropium-albuterol) .... 3 ml nebs 2-3  x a day as needed 8)  Cetirizine Hcl 10 Mg Tabs (Cetirizine hcl) .Marland Kitchen.. 1 tab by mouth qpm 9)  Hydrochlorothiazide 25 Mg Tabs (Hydrochlorothiazide) .... 1/2 tab by mouth qam  Other Orders: Admin 1st Vaccine ([id number]) Flu Vaccine 10yrs + [phone]) T-Lipid Profile [id number]) T-TSH [phone])  Patient Instructions: 1)  Start HCTZ 1/2 tab every morning for high blood pressure. 2)  Use your Dulera 2 x a day everyday and remember to rinse mouth after each use. 3)  Use Combivent for RESCUE. 4)  Keep working on smoking cessation. 5)  Flu shot done today. 6)  Last Pelvic 5-09. 7)  REturn for CPE with pap in 3 wks. 8)  Will Update your fasting labs and your sleep study. Flu Vaccine Consent Questions     Do you have a history of severe allergic reactions to this vaccine? no    Any prior history of allergic reactions to egg and/or gelatin? no    Do you have a sensitivity to the preservative Thimersol? no    Do you have a past history of Guillan-Barre Syndrome? no    Do you currently have an acute febrile illness? no    Have you ever had a severe reaction to latex? no    Vaccine information given and explained to patient? yes    Are you currently pregnant? no    Lot Number:[id number]   Exp Date:10/04/2010   Site Given  Left Deltoid [id number]] Prescriptions: HYDROCHLOROTHIAZIDE 25 MG TABS (HYDROCHLOROTHIAZIDE) 1/2 tab by mouth qAM  #30 x 2   Entered and Authorized by:   Seymour Bars  DO   Signed by:   Seymour Bars DO on 01/06/2010   Method used:   Electronically to        [facility] [phone]* (retail)       [address]       [place], [place]  [address]       Ph: [phone] or [phone]       Fax: [phone]   RxID:   [id number]     .lbflu Laboratory Results   Urine Tests    Routine Urinalysis   Color: yellow Appearance: Clear Glucose: negative   (Normal Range: Negative) Bilirubin: negative   (Normal Range: Negative) Ketone: negative   (Normal Range: Negative) Spec. Gravity: 1.010   (Normal Range: 1.003-1.035) Blood: negative   (Normal Range: Negative) pH: 6.0   (Normal Range: 5.0-8.0) Protein: negative   (Normal Range: Negative) Urobilinogen: 0.2   (Normal Range: 0-1) Nitrite: negative   (Normal Range: Negative) Leukocyte Esterace: negative   (Normal Range: Negative)

## 2010-05-06 NOTE — Progress Notes (Signed)
Summary: [facility]  Phone Note Outgoing Call   Summary of Call: Marcelino Duster, pls call pt and let her know that her sleep study is + for sleep apnea.   I would like to see her back 3-4 wks after starting CPAP to see if we need to add some Requip for her severe nighttime limb jerks.   Initial call taken by: Seymour Bars DO,  February 25, 2010 12:43 PM  Follow-up for Phone Call        Pt aware of the above Follow-up by: Payton Spark CMA,  February 25, 2010 1:24 PM

## 2010-05-06 NOTE — Assessment & Plan Note (Signed)
Summary: MED REFILL//VGJ   Vital Signs:  Patient profile:   45 year old female Height:      64.4 inches Weight:      159 pounds BMI:     27.05 O2 Sat:      100 % on Room air Pulse rate:   68 / minute BP sitting:   134 / 83  (left arm) Cuff size:   regular  Vitals Entered By: Payton Spark CMA (September 17, 2009 9:18 AM)  O2 Flow:  Room air CC: Medication refills.    History of Present Illness: 45 yo woman here today for  1) vaginal pain- 'sharp stabbling pains going through my vagina'.  started 10 days ago.  went to hospital [facility]) at that time b/c of urethral swelling.  has hx of herpes.  has been taking Valtrex for 'a few days'.  + vaginal d/c- thick, white.  no itching.  no odor.  no abnormal bleeding.  2) Hyperlipidemia- taking Pravastatin daily, no N/V, abd pain.  3) Glucose intolerance- not fasting today.  increased thirst, no increase in urination.  denies increase in HAs from baseline, visual changes, CP, SOB (above baseline).  Current Medications (verified): 1)  Alprazolam 0.25 Mg  Tabs (Alprazolam) .Marland Kitchen.. 1 Tab By Mouth Two Times A Day As Needed Anxiety 2)  Pravastatin Sodium 40 Mg Tabs (Pravastatin Sodium) .Marland Kitchen.. 1 Tab By Mouth Qhs 3)  Ventolin Hfa 108 (90 Base) Mcg/act Aers (Albuterol Sulfate) .... 2 Puffs Q 4-6 Hrs Prn 4)  Topamax 100 Mg Tabs (Topiramate) .... Take1 Tab By Mouth Bid 5)  Valtrex 500 Mg Tabs (Valacyclovir Hcl) .Marland Kitchen.. 1 Tab By Mouth Q 12 Hrs X 3 Days As Needed For Outbreaks 6)  Effexor Xr 150 Mg Xr24h-Cap (Venlafaxine Hcl) .Marland Kitchen.. 1 Capsule By Mouth Daily 7)  Combivent 103-18 Mcg/act Aero (Ipratropium-Albuterol) .... 2 Puffs 4 X A Day X 10 Days  Allergies (verified): 1)  ! Niacin  Past History:  Past Medical History: Last updated: 07/13/2008 Panic Attacks HTN  migraines dyslipidemia mild COPD, PFTs 07-2008  [id number]  Review of Systems      See HPI  Physical Exam  General:  Overweight-appearing female in no acute distress.  Head:   Normocephalic and atraumatic.  Lungs:  Clear to auscultation bilaterally with no wheezes, rales, or rhonchi. Normal work of breathing.  Heart:  RRR, normal S1 and S2. No murmurs, rubs, or gallops.  Abdomen:  soft, NT/ND, +BS Genitalia:  deferred at pt's request Pulses:  +2 radial/DP Extremities:  no C/C/E   Impression & Recommendations:  Problem # 1:  DYSLIPIDEMIA (ICD-272.4) Assessment Unchanged pt to return for fasting labs.  has not changed diet or started exercising since starting meds- encouraged this.  refill provided. Her updated medication list for this problem includes:    Pravastatin Sodium 40 Mg Tabs (Pravastatin sodium) .Marland Kitchen... 1 tab by mouth qhs  Orders: Venipuncture (91478) TLB-Lipid Panel (80061-LIPID) TLB-Hepatic/Liver Function Pnl (80076-HEPATIC)  Problem # 2:  IMPAIRED FASTING GLUCOSE (ICD-790.21) Assessment: New pt's last labs indicate elevated glucose.  pt not fasting this AM.  return for labs. Orders: TLB-BMP (Basic Metabolic Panel-BMET) (80048-METABOL) TLB-TSH (Thyroid Stimulating Hormone) (84443-TSH) TLB-CBC Platelet - w/Differential (85025-CBCD)  Problem # 3:  UNSPEC SYMPTOM ASSOC W/FEMALE GENITAL ORGANS (ICD-625.9) Assessment: New pt describing vaginal pain but refusing exam.  asked if herpes could be causing her pain- was told that yes, this was possible.  pt replied 'that's all i wanted to know'.  strongly encouraged her to  f/u if no improvement in sxs.  Complete Medication List: 1)  Alprazolam 0.25 Mg Tabs (Alprazolam) .Marland Kitchen.. 1 tab by mouth two times a day as needed anxiety 2)  Pravastatin Sodium 40 Mg Tabs (Pravastatin sodium) .Marland Kitchen.. 1 tab by mouth qhs 3)  Ventolin Hfa 108 (90 Base) Mcg/act Aers (Albuterol sulfate) .... 2 puffs q 4-6 hrs prn 4)  Topamax 100 Mg Tabs (Topiramate) .... Take1 tab by mouth bid 5)  Valtrex 500 Mg Tabs (Valacyclovir hcl) .Marland Kitchen.. 1 tab by mouth q 12 hrs x 3 days as needed for outbreaks 6)  Effexor Xr 150 Mg Xr24h-cap (Venlafaxine  hcl) .Marland Kitchen.. 1 capsule by mouth daily 7)  Combivent 103-18 Mcg/act Aero (Ipratropium-albuterol) .... 2 puffs 4 x a day x 10 days  Patient Instructions: 1)  Please return for your labs when you haven't had anything to eat or drink 2)  Your meds are all at the pharmacy 3)  If your vaginal pain isn't improving or you would like to change your medicines, please schedule an appt with Dr Cathey Endow 4)  Have a great summer! Prescriptions: ALPRAZOLAM 0.25 MG  TABS (ALPRAZOLAM) 1 tab by mouth two times a day as needed anxiety  #45 x 0   Entered by:   Payton Spark CMA   Authorized by:   Neena Rhymes MD   Signed by:   Payton Spark CMA on 09/17/2009   Method used:   Print then Give to Patient   RxID:   [id number] EFFEXOR XR 150 MG XR24H-CAP (VENLAFAXINE HCL) 1 capsule by mouth daily  #90 x 1   Entered by:   Payton Spark CMA   Authorized by:   Neena Rhymes MD   Signed by:   Payton Spark CMA on 09/17/2009   Method used:   Electronically to        [facility]. [phone]* (retail)       [address]       [place], [place]  [address]       Ph: [phone] or [phone]       Fax: [phone]   RxID:   [id number] TOPAMAX 100 MG TABS (TOPIRAMATE) Take1 tab by mouth bid  #180 x 1   Entered by:   Payton Spark CMA   Authorized by:   Neena Rhymes MD   Signed by:   Payton Spark CMA on 09/17/2009   Method used:   Electronically to        [facility]. [phone]* (retail)       [address]       [place], [place]  [address]       Ph: [phone] or [phone]       Fax: [phone]   RxID:   [id number] PRAVASTATIN SODIUM 40 MG TABS (PRAVASTATIN SODIUM) 1 tab by mouth qhs  #90 x 1   Entered by:   Payton Spark CMA   Authorized by:   Neena Rhymes MD   Signed by:   Payton Spark CMA on 09/17/2009   Method used:   Electronically to        [facility]. [phone]* (retail)        [address]       [place], [place]  [address]       Ph: [phone] or [phone]       Fax: [phone]  RxID:   [id number]

## 2010-05-06 NOTE — Consult Note (Signed)
Summary: [facility] [employer] [place]   Imported By: Lanelle Bal 12/12/2009 08:46:01  _____________________________________________________________________  External Attachment:    Type:   Image     Comment:   External Document

## 2010-05-06 NOTE — Assessment & Plan Note (Signed)
Summary: CPE with pap   Vital Signs:  Patient profile:   45 year old female Menstrual status:  hysterectomy Height:      64.4 inches Weight:      156 pounds BMI:     26.54 O2 Sat:      100 % on Room air Pulse rate:   75 / minute BP sitting:   116 / 74  (left arm) Cuff size:   regular  Vitals Entered By: Rose [name] CMA (February 03, 2010 10:25 AM)  O2 Flow:  Room air CC: CPE w/ pap     Menstrual Status hysterectomy Last PAP Result Normal   Primary Care Provider:  Seymour Bars DO  CC:  CPE w/ pap.  History of Present Illness: 45 yo WF presents for CPE with pap.  It has been 2 yrs since her last pap.  She is s/p TAH for endometriosis with one remaining ovary.  Doing well.  married, monogamous iwth hx of genital HSV.  No recent outbreaks.  Due for her mammogram.  Had fasting labs drawn last month.  Continues to smoke (with COPD and OSA).  Not exercising on a regular basis.  Had a colonoscopy 2 yrs ago that was normal. Has + fam hx of premature AMI (sister) and it has been > 5 yrs since her last stress test.  She denies CP but has some DOE.  Allergies (verified): 1)  ! Niacin  Past History:  Past Medical History: Panic Attacks HTN  migraines dyslipidemia mild COPD, PFTs at [facility] 11-2009 Mold allergies, on immunotherapy with Dr Rose [name] OSA, on CPAP colonoscopy done 2009 - internal hemorrhoids  [id number]  Past Surgical History: Reviewed history from 08/17/2007 and no changes required. partial hysterectomy for endometriosis saline breast implants 1994  Family History: Reviewed history from 10/31/2007 and no changes required. Cancer: dad (prostate), mother (breast) MI: dad Hypercholesterol: dad, mom, brothers HTN: dad, brothers sister AMI at 29  Social History: Reviewed history from 02/24/2008 and no changes required. 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   Smokes 1 ppd x 30 yrs.    Review of Systems  The patient denies anorexia, fever, weight loss, weight gain, vision loss, decreased hearing, hoarseness, chest pain, syncope, dyspnea on exertion, peripheral edema, prolonged cough, headaches, hemoptysis, abdominal pain, melena, hematochezia, severe indigestion/heartburn, hematuria, incontinence, genital sores, muscle weakness, suspicious skin lesions, transient blindness, difficulty walking, depression, unusual weight change, abnormal bleeding, enlarged lymph nodes, angioedema, breast masses, and testicular masses.    Physical Exam  General:  alert, well-developed, well-nourished, and well-hydrated.   Head:  normocephalic and atraumatic.   Eyes:  pupils equal, pupils round, and pupils reactive to light.   Ears:  no external deformities.   Mouth:  pharynx pink and moist and fair dentition.   Neck:  no masses.   Breasts:  bilat implants (hard) w/o any noticeable abnromalities. Lungs:  Normal respiratory effort, chest expands symmetrically. Lungs are clear to auscultation, no crackles or wheezes.  prolonged exp phase Heart:  normal rate, regular rhythm, and no murmur.   Abdomen:  soft, non-tender, normal bowel sounds, no distention, no masses, no guarding, no hepatomegaly, and no splenomegaly.  no AA bruits Pulses:  2+ radial and pedal pulses Extremities:  no LE edema Skin:  color normal.   Cervical Nodes:  No lymphadenopathy noted Psych:  good eye contact, not anxious appearing, and not depressed appearing.     Impression & Recommendations:  Problem # 1:  ROUTINE GYNECOLOGICAL EXAMINATION (ICD-V72.31) Keeping healthy checklist for women reviewed. BP at goal.  BMI 26.5 = overwt. Mammogram ordered. DEXA due at 21 (still has 1 functional ovary). Thin prep pap done today. Tdap due 2015.  Had flu shot.  Labs done 10-28, copy given to pt. Will update stress echo for fam hx of premature CVD,  smoker, HTN, dyslipidemia. MVI/ calcium with D, higher fiber diet recommened along with regular exercise.  Complete Medication List: 1)  Alprazolam 0.25 Mg Tabs (Alprazolam) .Marland Kitchen.. 1 tab by mouth two times a day as needed anxiety 2)  Pravastatin Sodium 40 Mg Tabs (Pravastatin sodium) .Marland Kitchen.. 1 tab by mouth qhs 3)  Ventolin Hfa 108 (90 Base) Mcg/act Aers (Albuterol sulfate) .... 2 puffs q 4-6 hrs prn 4)  Topamax 100 Mg Tabs (Topiramate) .... Take1 tab by mouth bid 5)  Valtrex 500 Mg Tabs (Valacyclovir hcl) .Marland Kitchen.. 1 tab by mouth q 12 hrs x 3 days as needed for outbreaks 6)  Effexor Xr 150 Mg Xr24h-cap (Venlafaxine hcl) .Marland Kitchen.. 1 capsule by mouth daily 7)  Duoneb 0.5-2.5 (3) Mg/72ml Soln (Ipratropium-albuterol) .... 3 ml nebs 2-3  x a day as needed 8)  Cetirizine Hcl 10 Mg Tabs (Cetirizine hcl) .Marland Kitchen.. 1 tab by mouth qpm 9)  Hydrochlorothiazide 25 Mg Tabs (Hydrochlorothiazide) .... 1/2 tab by mouth qam  Other Orders: T-Echo Transthoracic (91478) T-Mammography Bilateral Screening (29562)  Patient Instructions: 1)  Stay on current meds. 2)  Increase water intake so that your urine is a pale yellow. 3)  Work on [employer], regular exercise, high fiber. 4)  Will order a stress ECHO at [facility]. 5)  Schedule your mammogram. 6)  Return for f/u COPD/ sleep apnea in 3 mos.   Orders Added: 1)  T-Echo Transthoracic [93350] 2)  T-Mammography Bilateral Screening [77057] 3)  Est. Patient age 74-64 [phone]

## 2010-05-07 ENCOUNTER — Ambulatory Visit: Payer: Self-pay | Admitting: Family Medicine

## 2010-05-07 ENCOUNTER — Ambulatory Visit: Admit: 2010-05-07 | Payer: Self-pay | Admitting: Family Medicine

## 2010-05-08 ENCOUNTER — Encounter: Payer: Self-pay | Admitting: Family Medicine

## 2010-05-08 ENCOUNTER — Ambulatory Visit (INDEPENDENT_AMBULATORY_CARE_PROVIDER_SITE_OTHER): Payer: BC Managed Care – PPO | Admitting: Family Medicine

## 2010-05-08 DIAGNOSIS — G4730 Sleep apnea, unspecified: Secondary | ICD-10-CM

## 2010-05-08 DIAGNOSIS — I10 Essential (primary) hypertension: Secondary | ICD-10-CM

## 2010-05-08 DIAGNOSIS — J449 Chronic obstructive pulmonary disease, unspecified: Secondary | ICD-10-CM

## 2010-05-08 DIAGNOSIS — G2581 Restless legs syndrome: Secondary | ICD-10-CM

## 2010-05-14 ENCOUNTER — Ambulatory Visit: Payer: Self-pay | Admitting: Family Medicine

## 2010-05-14 NOTE — Assessment & Plan Note (Signed)
Summary: f/u OSA   Vital Signs:  Patient profile:   45 year old female Menstrual status:  hysterectomy Height:      64.4 inches Weight:      157 pounds BMI:     26.71 O2 Sat:      98 % on Room air Pulse rate:   84 / minute BP sitting:   125 / 73  (left arm) Cuff size:   regular  Vitals Entered By: Payton Spark CMA (May 08, 2010 9:58 AM)  O2 Flow:  Room air CC: F/U.    Primary Care Provider:  Seymour Bars DO  CC:  F/U. Marland Kitchen  History of Present Illness: 45 yo WF presents for f/u sleep study.  She went for her sleep study at [facility].  She was already wearing her old CPAP but is supposed to have it re-titrated.   She did have RLS on her study and is not taking anything.  This does sometimes impair her ability to sleep but tends to bother her husband more than here.  She has cut back on her smoking, using an e- cigarette now.  She is rarely needing to use her DuoNeb and her breathing has been stable.  She had PFTs updated in 2011.  She had a normal stress echo and plans to update her mammogram soon.    Current Medications (verified): 1)  Alprazolam 0.25 Mg  Tabs (Alprazolam) .Marland Kitchen.. 1 Tab By Mouth Two Times A Day As Needed Anxiety 2)  Pravastatin Sodium 40 Mg Tabs (Pravastatin Sodium) .Marland Kitchen.. 1 Tab By Mouth Qhs 3)  Ventolin Hfa 108 (90 Base) Mcg/act Aers (Albuterol Sulfate) .... 2 Puffs Q 4-6 Hrs Prn 4)  Topamax 100 Mg Tabs (Topiramate) .... Take1 Tab By Mouth Bid 5)  Valtrex 500 Mg Tabs (Valacyclovir Hcl) .Marland Kitchen.. 1 Tab By Mouth Q 12 Hrs X 3 Days As Needed For Outbreaks 6)  Effexor Xr 150 Mg Xr24h-Cap (Venlafaxine Hcl) .Marland Kitchen.. 1 Capsule By Mouth Daily 7)  Duoneb 0.5-2.5 (3) Mg/67ml Soln (Ipratropium-Albuterol) .... 3 Ml Nebs 2-3  X A Day As Needed 8)  Cetirizine Hcl 10 Mg Tabs (Cetirizine Hcl) .Marland Kitchen.. 1 Tab By Mouth Qpm 9)  Hydrochlorothiazide 25 Mg Tabs (Hydrochlorothiazide) .... 1/2 Tab By Mouth Qam  Allergies (verified): 1)  ! Niacin  Past History:  Past Medical  History: Reviewed history from 02/03/2010 and no changes required. Panic Attacks HTN  migraines dyslipidemia mild COPD, PFTs at [facility] 11-2009 Mold allergies, on immunotherapy with Dr Mikey Bussing OSA, on CPAP colonoscopy done 2009 - internal hemorrhoids  [id number]  Social History: Reviewed history from 02/24/2008 and no changes required. 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   Smokes 1 ppd x 30 yrs.    Review of Systems      See HPI  Physical Exam  General:  alert, well-developed, well-nourished, and well-hydrated.   Head:  normocephalic and atraumatic.   Mouth:  pharynx pink and moist.   Neck:  no masses.   Lungs:  Normal respiratory effort, chest expands symmetrically. Lungs are clear to auscultation, no crackles or wheezes.  prolonged exp phase Heart:  normal rate, regular rhythm, and no murmur.   Extremities:  no LE edema Skin:  color normal.   Psych:  good eye contact, not anxious appearing, and not depressed appearing.  Impression & Recommendations:  Problem # 1:  SLEEP APNEA (ICD-780.57) Will find out why she was not been scheduled to complete part 2 of her sleep study.  Has an old CPAP and does wear it every night.  She probably needs re-titration and some new equipment.  She is interested in REquip for the RLS seen on sleep study.  Will start a low dose.  Problem # 2:  COPD (ICD-496) Stable.  Had PFTs in 2011 at [facility] and is working on smoking cessation. Her updated medication list for this problem includes:    Ventolin Hfa 108 (90 Base) Mcg/act Aers (Albuterol sulfate) .Marland Kitchen... 2 puffs q 4-6 hrs prn    Duoneb 0.5-2.5 (3) Mg/63ml Soln (Ipratropium-albuterol) .Marland KitchenMarland KitchenMarland KitchenMarland Kitchen 3 ml nebs 2-3  x a day as needed  Problem # 3:  HYPERTENSION, BENIGN ESSENTIAL (ICD-401.1) BP at goal and labs UTD.  Continue current meds. Her  updated medication list for this problem includes:    Hydrochlorothiazide 25 Mg Tabs (Hydrochlorothiazide) .Marland Kitchen... 1/2 tab by mouth qam  BP today: 125/73 Prior BP: 116/74 (02/03/2010)  Labs Reviewed: K+: 4.4 (01/31/2010) Creat: : 0.81 (01/31/2010)   Chol: 209 (01/31/2010)   HDL: 47 (01/31/2010)   LDL: 108 (01/31/2010)   TG: 272 (01/31/2010)  Complete Medication List: 1)  Alprazolam 0.25 Mg Tabs (Alprazolam) .Marland Kitchen.. 1 tab by mouth two times a day as needed anxiety 2)  Pravastatin Sodium 40 Mg Tabs (Pravastatin sodium) .Marland Kitchen.. 1 tab by mouth qhs 3)  Ventolin Hfa 108 (90 Base) Mcg/act Aers (Albuterol sulfate) .... 2 puffs q 4-6 hrs prn 4)  Topamax 100 Mg Tabs (Topiramate) .... Take1 tab by mouth bid 5)  Valtrex 500 Mg Tabs (Valacyclovir hcl) .Marland Kitchen.. 1 tab by mouth q 12 hrs x 3 days as needed for outbreaks 6)  Effexor Xr 150 Mg Xr24h-cap (Venlafaxine hcl) .Marland Kitchen.. 1 capsule by mouth daily 7)  Duoneb 0.5-2.5 (3) Mg/76ml Soln (Ipratropium-albuterol) .... 3 ml nebs 2-3  x a day as needed 8)  Cetirizine Hcl 10 Mg Tabs (Cetirizine hcl) .Marland Kitchen.. 1 tab by mouth qpm 9)  Hydrochlorothiazide 25 Mg Tabs (Hydrochlorothiazide) .... 1/2 tab by mouth qam 10)  Ropinirole Hcl 0.5 Mg Tabs (Ropinirole hcl) .... 1/2 tab by mouth qpm x 1 wk then increase to 1 tab by mouth qpm  Patient Instructions: 1)  Continue to work on smoking cessation. 2)  Will contact [facility] for you about completing your sleep study. 3)  Start Ropinirole at bedtime for restless legs.   4)  Update your mammogram. 5)  REturn for f/u in 3-4 mos. Prescriptions: ROPINIROLE HCL 0.5 MG TABS (ROPINIROLE HCL) 1/2 tab by mouth qPM x 1 wk then increase to 1 tab by mouth qPM  #30 x 2   Entered and Authorized by:   Seymour Bars DO   Signed by:   Seymour Bars DO on 05/08/2010   Method used:   Electronically to        [facility]. [phone]* (retail)       [address]       [place], [place]  [address]       Ph:  [phone] or [phone]       Fax: [phone]   RxID:   [id number]    Orders Added: 1)  Est. Patient Level III [76[address]]

## 2010-05-19 ENCOUNTER — Encounter: Payer: Self-pay | Admitting: Family Medicine

## 2010-05-26 ENCOUNTER — Telehealth (INDEPENDENT_AMBULATORY_CARE_PROVIDER_SITE_OTHER): Payer: Self-pay | Admitting: *Deleted

## 2010-06-03 NOTE — Progress Notes (Signed)
Summary: refill  Phone Note Refill Request Message from:  Fax from Pharmacy on May 26, 2010 10:35 AM  Refills Requested: Medication #1:  EFFEXOR XR 150 MG XR24H-CAP 1 capsule by mouth daily [facility] [address] Rosalita Levan - fax [phone]  Initial call taken by: Okey Regal [place],  May 26, 2010 10:36 AM    Prescriptions: EFFEXOR XR 150 MG XR24H-CAP (VENLAFAXINE HCL) 1 capsule by mouth daily  #90 x 1   Entered by:   Payton Spark CMA   Authorized by:   Seymour Bars DO   Signed by:   Payton Spark CMA on 05/26/2010   Method used:   Electronically to        [facility]. [phone]* (retail)       [address]       [place], [place]  [address]       Ph: [phone] or [phone]       Fax: [phone]   RxID:   [id number]

## 2010-06-25 ENCOUNTER — Other Ambulatory Visit: Payer: Self-pay | Admitting: Family Medicine

## 2010-06-25 DIAGNOSIS — B009 Herpesviral infection, unspecified: Secondary | ICD-10-CM

## 2010-06-25 MED ORDER — VALACYCLOVIR HCL 500 MG PO TABS: 500.0000 mg | ORAL_TABLET | Freq: Two times a day (BID) | ORAL | Status: DC

## 2010-07-03 ENCOUNTER — Other Ambulatory Visit: Payer: Self-pay | Admitting: Family Medicine

## 2010-07-03 NOTE — Telephone Encounter (Signed)
This is Dr. Cathey Endow pt, will route to her.

## 2010-07-04 ENCOUNTER — Other Ambulatory Visit: Payer: Self-pay | Admitting: Family Medicine

## 2010-07-04 MED ORDER — PRAVASTATIN SODIUM 40 MG PO TABS: ORAL_TABLET | ORAL | Status: DC

## 2010-08-22 NOTE — Op Note (Signed)
[facility]  Patient:    Rose [name], Rose [name]                    MRN: [id number] Proc. Date: 05/11/00 Adm. Date:  [id number] Attending:  Trevor Iha                           Operative Report  PREOPERATIVE DIAGNOSIS:       Pelvic pain with dyspareunia and probable fibroids.  POSTOPERATIVE DIAGNOSIS:      1. Pelvic pain with dyspareunia and probable                                  fibroids.                               2. Pelvic adhesions.  OPERATION:                    1. laparoscopic-assisted vaginal hysterectomy.                               2. Left salpingo-oophorectomy.                               3. Lysis of adhesions.  SURGEON:                      Trevor Iha, M.D.  ASSISTANTWilley Blade, M.D.  ANESTHESIA:                   General endotracheal.  INDICATIONS:                  Ms. Rounds is a 45 year old G2, P2, with worsening dyspareunia and pelvic pain not responsive to conservative medical management including oral contraceptive agents, anti-inflammatory medications, or narcotics.  It is current incapacitating.  She desires to undergo surgical intervention as probable fibroid found on ultrasound as well.  She presents today for the above procedure.  See History & Physical for further details. Informed consent was obtained.  FINDINGS AT TIME OF SURGERY:  Adhesions from the appendix to the right lateral wall.  Most of the right adhesed to the cul-de-sac.  The left ovary had a 2 to 3 cm hemorrhagic-appearing cyst.  Otherwise normal-appearing gallbladder and appendix.  DESCRIPTION OF PROCEDURE:     After adequate analgesia, the patient was placed in dorsolithotomy position.  She was sterilely prepped and draped.  Her bladder was thoroughly drained.  A Hulka tenaculum was placed in the cervix. A 1 cm infraumbilical skin incision was made. A Veress needle was inserted. The abdomen was insufflated  to dullness to percussion, and a 12 mm trocar was inserted.  A 5 mm trocar was inserted to left of midline two fingerbreadths above the pubis symphysis, entered sharply with digitalization, and a 5 mm trocar was inserted.  Laparoscope was inserted, and the above findings were noted.  Tripolar cauterizer as used to sharply dissect the cul-de-sac adhesions from the right ovary.  Cautery and lysis of adhesions were performed using again the tripolar  cautery with care taken to avoid the underlying ureter and also blood vessels.  Good hemostasis was achieved, and the right tube and ovary were freed from the cul-de-sac and were free and mobile. Because the left ovary had a large hemorrhagic-appearing cyst and she had predominance of pain on the left side, at this time we decided to proceed with a left salpingo-oophorectomy.  However, before this, we did sharply dissect adhesions from the appendix as it was stuck down the right lateral wall to the brim of the pelvis.  It was sharply dissected from the right lateral wall using tripolar cautery, again using care to avoid vessels and also the lumen of the appendix.  Good hemostasis was achieved.  After lysis of adhesions, the appendix were moved from the right lateral wall and were free and mobile, and no obvious lesions were noted on the appendix.  At this time, the left infundibulopelvic ligament was identified.  The tripolar cautery was used across the left infundibulopelvic ligament with good cauterization and care to avoid the ureter.  This was performed down across the round ligaments with good hemostasis achieved.  The right utero-ovarian ligament was cauterized in similar fashion down across the round ligament with good hemostasis with the right tube and ovary following lateral and good hemostasis achieved.  At this time, the abdomen was desufflated.  The legs were elevated.  Weight speculum was placed.  A Jacobs tenaculum was placed in the  posterior cervix. The Hulka tenaculum was removed.  Posterior colpotomy was performed with the Mayo scissors.  The cervix was circumscribed with Bovie cautery.  Sutures of 0 Monocryl were used throughout the procedure unless otherwise indicated.  The uterosacral ligaments were clamped, cut, and tied bilaterally.  The cardinal ligaments were then clamped, cut, and tied bilaterally.  The bladder pillars were then clamped, cut, and tied bilaterally with good hemostasis.  The bladder was dissected off the anterior edge of the cervix.  We entered anteriorly and placed the deeper retractor underneath the bladder.  At this time, successive bites across the lower portion of the broad ligaments were made bilaterally with good hemostasis achieved.  At this time, thyroid tenaculum was placed placed on anterior surface of the uterus, lowered onto the introitus.  The remaining pedicles were then clamped, cut, and ligated. The uterus was removed with the left tube and ovary intact.  At this time, the remaining pedicles appeared to be hemostatic.  Sutures of 0 Monocryl were placed across the uterosacral ligaments.  The posterior peritoneum was then closed with pursestring fashion with 0 Monocryl.  The vagina was then closed in a vertical fashion approximating the uterosacral ligaments in the midline using figure-of-eight of 0 Monocryl with good approximation and good hemostasis.  The anterior vagina was closed in a similar fashion with good hemostasis achieved.  At this time, the abdomen was reinsufflated. Examination with laparoscope revealed good hemostasis with the exception of a couple of small peritoneal edge bleeders on the vaginal cuff which were cauterized with Bovie cautery.  After irrigation was applied, adequate hemostasis was achieved.  Reexamination of all the pedicles revealed good hemostasis including the right tube and ovary and appendix.  At this time, the abdomen was desufflated.  The  trocars were removed.  The infraumbilical skin incision was closed with a 0 Vicryl in the fascia and 3-0 Vicryl raphe subcuticular stitch in the umbilicus.  The 5 mm trocar site was closed with  3-0 Vicryl in subcuticular fashion with good approximation  and good hemostasis.  The incisions were then injected with 0.25% Marcaine.  The patient tolerated the procedure well and was stable on transfer to the recovery room.  Estimated blood loss for the procedure was 150 cc. DD:  05/11/00 TD:  05/12/00 Job: [id number]

## 2010-08-22 NOTE — Discharge Summary (Signed)
[facility]  Patient:    [name], [name]                    MRN: [id number] Adm. Date:  [id number] Disc. Date: [id number] Attending:  Trevor Iha                           Discharge Summary  HISTORY OF PRESENT ILLNESS:   [name] is a 45 year old G6, P2 with worsening dyspareunia, pelvic pain, incapacitating dysmenorrhea not responding to conservative medical management, also history of left ovarian cyst and pain worse on the left side.  She presents for definitive surgical intervention and planned hysterectomy.  HOSPITAL COURSE:              Patient underwent a laparoscopically assisted vaginal hysterectomy and also a left salpingo-oophorectomy and lysis of adhesions.  Surgery was uncomplicated with an estimated blood loss of 150 cc. Her postoperative course was unremarkable with good return of bowel function and patient was ambulating without difficulty on postoperative day #1. Tolerated a regular diet.  Postoperative hemoglobin was 11.8.  Patient was discharged on postoperative day #1 without problems.  DISPOSITION:                  Patient will follow-up in the office in two weeks.  Told to return for increased fever, bleeding, pain.  She was also sent home with a prescription for Tylox #30. DD:  05/12/00 TD:  05/12/00 Job: [id number]

## 2010-08-22 NOTE — H&P (Signed)
[facility]  Patient:    Rose [name], Rose [name]                      MRN: [id number] Attending:  Trevor Iha, M.D.                         History and Physical  DATE OF BIRTH:                1965-07-30  HISTORY OF PRESENT ILLNESS:   Rose [name] is a 45 year old, G6, P2, with worsening dyspareunia and pelvic pain with incapacitating dysmenorrhea which has not responded to conservative medical management.  She is unable to take oral contraceptive agents due to increased blood pressure, smoking, and being greater than 60 years old.  She is currently on anti-inflammatory medication and narcotics for pain.  She currently desires hysterectomy to hopefully relieve the symptoms.  At the time of laparoscopy, she is hoping to also look for other causes of pain including possible ovarian cyst.  She did have a history of left ovarian cyst which did resolve by her last ultrasound on March 26, 2000.  The ultrasound also does show several small echogenic foci throughout the endometrium consistent with several small fibroids.  PAST MEDICAL HISTORY:         Negative.  The patient reports increased blood pressure on oral contraceptive agents but normal otherwise.  PAST SURGICAL HISTORY:        Significant for breast implants in 1992, bilateral tubal ligation in 1988.  MEDICATIONS:                  Currently Vicodin, Motrin.  ALLERGIES:                    No known drug allergies.  PHYSICAL EXAMINATION:  VITAL SIGNS:                  Blood pressure 118/80.  HEART:                        Regular rate and rhythm.  LUNGS:                        Clear to auscultation bilaterally.  ABDOMEN:                      Nondistended, nontender.  PELVIC:                       Uterus is retroverted and mobile, approximately 6-week size, mildly tender to movement.  No adnexal masses are palpable. Cervix is within normal limits.  Moderate decensus of the  uterus.  IMPRESSION AND PLAN:          1. Pelvic pain and dyspareunia.                               2. Incapacitating dysmenorrhea.                               3. Probable fibroids by ultrasound.  The patient has failed conservative management including anti-inflammatory medication, birth control pills which she cannot tolerate.  She desires definitive surgical intervention and requests hysterectomy.  Plan laparoscopic-assisted vaginal hysterectomy.  Plan evaluation of the tubes and ovaries at that time as well as the cul-de-sac for endometriosis and pelvic adhesions.  Will try to preserve ovaries if at all possible.  The patient does understand that hysterectomy may not alleviate the pelvic pain.  She also understands that there are risks associated with the surgery including risk of infection, bleeding, damage to uterus, tubes, ovaries, bowel, bladder, risk associated with blood transfusion.  She does give informed consent. DD:  05/10/00 TD:  05/10/00 Job: [id number]

## 2010-09-27 ENCOUNTER — Other Ambulatory Visit: Payer: Self-pay | Admitting: Family Medicine

## 2010-09-29 ENCOUNTER — Telehealth: Payer: Self-pay | Admitting: Family Medicine

## 2010-09-29 MED ORDER — TOPIRAMATE 100 MG PO TABS: 100.0000 mg | ORAL_TABLET | Freq: Two times a day (BID) | ORAL | Status: DC

## 2010-09-29 NOTE — Telephone Encounter (Signed)
I printed RX for Topiramate for #180 tabs, no RFs.  She is due for f/u visit in 2 mos.

## 2010-09-29 NOTE — Telephone Encounter (Signed)
Pt appears to being seeing Seymour Bars, DO now. Will forward.

## 2011-01-26 ENCOUNTER — Other Ambulatory Visit: Payer: Self-pay | Admitting: *Deleted

## 2011-01-26 MED ORDER — VENLAFAXINE HCL ER 150 MG PO CP24: 150.0000 mg | ORAL_CAPSULE | Freq: Every day | ORAL | Status: DC

## 2011-03-09 ENCOUNTER — Other Ambulatory Visit: Payer: Self-pay | Admitting: *Deleted

## 2011-03-09 MED ORDER — ALPRAZOLAM 0.25 MG PO TABS: 0.2500 mg | ORAL_TABLET | Freq: Two times a day (BID) | ORAL | Status: DC | PRN

## 2011-03-09 MED ORDER — HYDROCHLOROTHIAZIDE 25 MG PO TABS: 25.0000 mg | ORAL_TABLET | Freq: Every day | ORAL | Status: DC

## 2011-03-20 ENCOUNTER — Other Ambulatory Visit: Payer: Self-pay | Admitting: Family Medicine

## 2011-06-08 ENCOUNTER — Other Ambulatory Visit: Payer: Self-pay | Admitting: *Deleted

## 2011-06-08 MED ORDER — TOPIRAMATE 100 MG PO TABS: 100.0000 mg | ORAL_TABLET | Freq: Two times a day (BID) | ORAL | Status: DC

## 2011-06-28 ENCOUNTER — Other Ambulatory Visit: Payer: Self-pay | Admitting: Family Medicine

## 2011-06-29 ENCOUNTER — Other Ambulatory Visit: Payer: Self-pay | Admitting: Family Medicine

## 2011-06-29 ENCOUNTER — Other Ambulatory Visit: Payer: Self-pay | Admitting: *Deleted

## 2011-06-29 MED ORDER — ALPRAZOLAM 0.25 MG PO TABS: 0.2500 mg | ORAL_TABLET | Freq: Two times a day (BID) | ORAL | Status: DC | PRN

## 2011-07-13 ENCOUNTER — Encounter: Payer: Self-pay | Admitting: Physician Assistant

## 2011-07-13 ENCOUNTER — Ambulatory Visit (INDEPENDENT_AMBULATORY_CARE_PROVIDER_SITE_OTHER): Payer: BC Managed Care – PPO | Admitting: Physician Assistant

## 2011-07-13 VITALS — BP 116/69 | HR 78 | Temp 97.8°F | Ht 64.0 in | Wt 157.0 lb

## 2011-07-13 DIAGNOSIS — L600 Ingrowing nail: Secondary | ICD-10-CM

## 2011-07-13 DIAGNOSIS — J019 Acute sinusitis, unspecified: Secondary | ICD-10-CM

## 2011-07-13 DIAGNOSIS — R05 Cough: Secondary | ICD-10-CM

## 2011-07-13 MED ORDER — AMOXICILLIN-POT CLAVULANATE 875-125 MG PO TABS: 1.0000 | ORAL_TABLET | Freq: Two times a day (BID) | ORAL | Status: AC

## 2011-07-13 MED ORDER — ALBUTEROL SULFATE (2.5 MG/3ML) 0.083% IN NEBU: 2.5000 mg | INHALATION_SOLUTION | Freq: Four times a day (QID) | RESPIRATORY_TRACT | Status: AC | PRN

## 2011-07-13 NOTE — Progress Notes (Signed)
  Subjective:    Patient ID: Rose [name], female    DOB: 01-Nov-1965, 46 y.o.   MRN: [id number]  HPI Patient presents to the clinic with 3 weeks of sinus drainage and pressure along with cold-like symptoms. Her symptoms started with sinus pressure but has since moved into chest with a dry cough with no production. She has had a low grade fever on and off for the last week. She also has sore throat, bilateral ear pain, and SOB. She only get SOB when she has a coughing spell. She has had headaches off and on during the 3 weeks. She has tried Dayquil and Nyquil which has helped some but has continued to worsen. She has a nebulizer at home but has not used it.   She complains of pain on her left great toe. She has to wear steel toe boots at worse and it causes her ingrown toenail to be worse. She has tried to cut the toenail down herself but it has not helped. This has been ongoing for the last month.     Review of Systems     Objective:   Physical Exam  Constitutional: She is oriented to person, place, and time. She appears well-developed and well-nourished.  HENT:  Head: Normocephalic and atraumatic.  Right Ear: External ear normal.  Left Ear: External ear normal.  Nose: Nose normal.  Mouth/Throat: Oropharynx is clear and moist. No oropharyngeal exudate.       TM's normal. Frontal sinus tenderness.   Eyes: Conjunctivae are normal.  Neck: Normal range of motion.       Right anterior cervical enlargement.  Cardiovascular: Normal rate, regular rhythm and normal heart sounds.   Pulmonary/Chest: Effort normal and breath sounds normal. She has no wheezes.       Dry hacking cough.  Lymphadenopathy:    She has cervical adenopathy.  Neurological: She is alert and oriented to person, place, and time.  Skin:       Lateral edge at the base of left great toe presents with erythema and some swelling. No infection present with no blood or pus coming from the base of toe.  Psychiatric: She has a  normal mood and affect. Her behavior is normal.          Assessment & Plan:  Sinusitis/Cough-Start Augmentin twice a day for 10 days. Use delsym for cough but cough as much as you can during the day. Use nebulizer as needed up to every 6 hours. Ibuprofen and tylenol as needed for headaches and other aches and pains.    Ingrown left great toe- Educated patient that I could to a partial toe nail removal to solve the problem. If she wanted to wait and see if it improves I gave her pointers such as using epson salt toe soaks and bandaging her toes with cushion in her shoes to avoid further trauma. If want relief she can make appointment to have toe nails removed.

## 2011-07-13 NOTE — Patient Instructions (Addendum)
Start Augmentin twice a day for 10 days. Use delsym for cough but cough as much as you can during the day. Use nebulizer as needed. Ibuprofen and tylenol as needed for headaches and other aches and pains. Gave some ideas for ingrown toe nail such as epson salt and wrapping toe in shoes. Call if need toe nail removed or not improving.

## 2011-08-10 ENCOUNTER — Other Ambulatory Visit: Payer: Self-pay | Admitting: *Deleted

## 2011-08-10 MED ORDER — PRAVASTATIN SODIUM 40 MG PO TABS: ORAL_TABLET | ORAL | Status: DC

## 2011-09-12 ENCOUNTER — Other Ambulatory Visit: Payer: Self-pay | Admitting: Family Medicine

## 2011-11-09 ENCOUNTER — Ambulatory Visit (INDEPENDENT_AMBULATORY_CARE_PROVIDER_SITE_OTHER): Payer: BC Managed Care – PPO | Admitting: Sports Medicine

## 2011-11-09 ENCOUNTER — Encounter: Payer: Self-pay | Admitting: Sports Medicine

## 2011-11-09 ENCOUNTER — Ambulatory Visit (INDEPENDENT_AMBULATORY_CARE_PROVIDER_SITE_OTHER): Payer: BC Managed Care – PPO

## 2011-11-09 VITALS — BP 137/83 | HR 88 | Temp 98.4°F | Wt 159.0 lb

## 2011-11-09 DIAGNOSIS — J441 Chronic obstructive pulmonary disease with (acute) exacerbation: Secondary | ICD-10-CM

## 2011-11-09 DIAGNOSIS — R059 Cough, unspecified: Secondary | ICD-10-CM

## 2011-11-09 DIAGNOSIS — R05 Cough: Secondary | ICD-10-CM

## 2011-11-09 DIAGNOSIS — F17200 Nicotine dependence, unspecified, uncomplicated: Secondary | ICD-10-CM

## 2011-11-09 DIAGNOSIS — R0602 Shortness of breath: Secondary | ICD-10-CM

## 2011-11-09 MED ORDER — DOXYCYCLINE HYCLATE 100 MG PO TABS: 100.0000 mg | ORAL_TABLET | Freq: Two times a day (BID) | ORAL | Status: AC

## 2011-11-09 MED ORDER — IPRATROPIUM-ALBUTEROL 18-103 MCG/ACT IN AERO: 2.0000 | INHALATION_SPRAY | RESPIRATORY_TRACT | Status: DC | PRN

## 2011-11-09 MED ORDER — PREDNISONE 50 MG PO TABS: ORAL_TABLET | ORAL | Status: AC

## 2011-11-09 MED ORDER — BENZONATATE 100 MG PO CAPS: ORAL_CAPSULE | ORAL | Status: AC

## 2011-11-09 NOTE — Progress Notes (Signed)
Patient ID: Rose [name], female   DOB: Feb 05, 1966, 46 y.o.   MRN: [id number] Subjective:   CC: Cough, pain in left chest.  HPI: [name] is a very pleasant 46 year old female who comes in to discuss the recent cough that occurred approximately daily. It is nonproductive, and is associated with shortness of breath. She does have some resultant pain in her left chest, and up to the back of her neck this is not exertional, not associated with diaphoresis, or presyncope it is exacerbated by her cough. She is a known history of COPD, and is completely out of her inhaler medications. She did have used some of her husband's Combivent.  Past medical history, surgical history, family history, social history, allergies, and medications reviewed from the medical record and no changes needed.  Review of Systems: No fevers, chills, night sweats, weight loss, chest pain, or shortness of breath.    Objective:  General:  Well Developed, well nourished, and in no acute distress. Neuro:  Alert and oriented x3, extra-ocular muscles intact. Skin: Warm and dry, no rashes noted. Cardiac: Regular rate and rhythm, no murmurs, rubs, or gallops. Respiratory:  Coarse sounds bilaterally, no wheezes, no crackles noted. Not using accessory muscles, and speaking full sentences.   Assessment & Plan:

## 2011-11-09 NOTE — Assessment & Plan Note (Addendum)
Patient is currently in acute exacerbation. I do not suspect pneumonia, however I would like to see chest x-ray, as this would change her management. Chest x-ray is negative Avoid and treat her with a burst of prednisone, doxycycline, we'll refill her Combivent inhaler. Also be some Tessalon Perles for cough. I would like to see her back in 7 days if no better.

## 2011-12-08 ENCOUNTER — Other Ambulatory Visit: Payer: Self-pay | Admitting: Family Medicine

## 2012-01-21 ENCOUNTER — Other Ambulatory Visit: Payer: Self-pay | Admitting: Family Medicine

## 2012-01-28 ENCOUNTER — Other Ambulatory Visit: Payer: Self-pay | Admitting: Family Medicine

## 2012-03-22 ENCOUNTER — Encounter: Payer: Self-pay | Admitting: Family Medicine

## 2012-03-22 ENCOUNTER — Ambulatory Visit (INDEPENDENT_AMBULATORY_CARE_PROVIDER_SITE_OTHER): Payer: BC Managed Care – PPO | Admitting: Family Medicine

## 2012-03-22 VITALS — BP 122/80 | HR 80 | Ht 64.0 in | Wt 163.0 lb

## 2012-03-22 DIAGNOSIS — F410 Panic disorder [episodic paroxysmal anxiety] without agoraphobia: Secondary | ICD-10-CM

## 2012-03-22 DIAGNOSIS — K5900 Constipation, unspecified: Secondary | ICD-10-CM

## 2012-03-22 DIAGNOSIS — Z298 Encounter for other specified prophylactic measures: Secondary | ICD-10-CM

## 2012-03-22 DIAGNOSIS — Z23 Encounter for immunization: Secondary | ICD-10-CM

## 2012-03-22 DIAGNOSIS — J441 Chronic obstructive pulmonary disease with (acute) exacerbation: Secondary | ICD-10-CM

## 2012-03-22 DIAGNOSIS — F329 Major depressive disorder, single episode, unspecified: Secondary | ICD-10-CM

## 2012-03-22 DIAGNOSIS — G43909 Migraine, unspecified, not intractable, without status migrainosus: Secondary | ICD-10-CM

## 2012-03-22 DIAGNOSIS — I10 Essential (primary) hypertension: Secondary | ICD-10-CM

## 2012-03-22 DIAGNOSIS — E785 Hyperlipidemia, unspecified: Secondary | ICD-10-CM

## 2012-03-22 MED ORDER — VENLAFAXINE HCL ER 150 MG PO CP24: 150.0000 mg | ORAL_CAPSULE | Freq: Every day | ORAL | Status: DC

## 2012-03-22 MED ORDER — ALPRAZOLAM 0.25 MG PO TABS: 0.2500 mg | ORAL_TABLET | Freq: Two times a day (BID) | ORAL | Status: DC | PRN

## 2012-03-22 MED ORDER — HYDROCHLOROTHIAZIDE 25 MG PO TABS: 25.0000 mg | ORAL_TABLET | Freq: Every day | ORAL | Status: DC

## 2012-03-22 MED ORDER — DOCUSATE SODIUM 100 MG PO CAPS: 100.0000 mg | ORAL_CAPSULE | Freq: Two times a day (BID) | ORAL | Status: DC | PRN

## 2012-03-22 MED ORDER — TOPIRAMATE 100 MG PO TABS: 100.0000 mg | ORAL_TABLET | Freq: Two times a day (BID) | ORAL | Status: DC

## 2012-03-22 MED ORDER — PRAVASTATIN SODIUM 40 MG PO TABS: ORAL_TABLET | ORAL | Status: DC

## 2012-03-22 MED ORDER — TIOTROPIUM BROMIDE MONOHYDRATE 18 MCG IN CAPS: 18.0000 ug | ORAL_CAPSULE | Freq: Every day | RESPIRATORY_TRACT | Status: DC

## 2012-03-22 MED ORDER — IPRATROPIUM-ALBUTEROL 18-103 MCG/ACT IN AERO: 2.0000 | INHALATION_SPRAY | RESPIRATORY_TRACT | Status: DC | PRN

## 2012-03-22 NOTE — Progress Notes (Signed)
CC: Rose [name] is a 46 y.o. female is here for medication refills and Immunizations   Subjective: HPI:  Patient presents for medication refills with 2 acute complaints. She notes that she is never established with a PCP since Dr. Cathey Endow left.  She reports having one bowel movement a week, when this occurs it is firm somewhat painful but nonbloody. Interventions included unspecified laxatives in the past which have helped temporarily. Between bowel movements she has pressure in her lower abdomen and pelvis is eventually completely resolved with a bowel movement. She states his bowel habits have been going on for years, and she like to become more regular. There been no dietary interventions to.  COPD: She reports using Combivent 2-3 times a day for cough and shortness of breath. She is unable to clarify stairs without getting short of breath, she can walk an asile and gets mild shortness of breath she's never had exertional or rest chest pain. she denies orthopnea nor PND. She continues to smoke. She has a chronic cough that has not changed in severity or character it has been present for months. It interferes with her quality of life. Her constellation of symptoms is described as moderate with respect interfering with her quality of life.  Hypertension: No outside blood pressures to report, continue the hydrochlorothiazide. Denies headaches, motor sensory disturbances, chest pain, irregular heartbeat, muscle cramps, nor polyuria.  Dyslipidemia: Continues to take Pravachol on a daily basis. Tries to watch her she eats, no formal exercise program. It's been well over a year since her cholesterol checked last. She denies right upper quadrant pain, myalgias, skin or scleral discoloration. Denies limb claudication nor rest pain of extremities  Panic disorder and depression: Has been taking Effexor for over a year she's quite pleased with the results  it helps with mood stabilization. Infrequently  taking Xanax for situational stressors including her daughter's declining health, and drama with in the family.  Denies depression, sadness, guilt, paranoia, hallucinations, sleep disturbance.  Migraines: Has been taking Topamax for over a year, she has less than one migraine a month which she reports is a fantastic improvement compared to prior.  No recent change in character or severity of migraines when they occur. She's unsure of triggers. When they occur they're mild in severity. She denies motor or sensory disturbances before during or after the migraines   Review Of Systems Outlined In HPI  Past Medical History  Diagnosis Date  . COPD 05/24/2007    Qualifier: Diagnosis of  By: Thomos Lemons    . DEPRESSION 05/24/2007    Qualifier: Diagnosis of  By: Thomos Lemons    . PANIC DISORDER 04/18/2007    Qualifier: Diagnosis of  By: Yetta Barre MD, Lauren       No family history on file.   History  Substance Use Topics  . Smoking status: Current Every Day Smoker -- 0.5 packs/day    Types: Cigarettes  . Smokeless tobacco: Not on file  . Alcohol Use: Not on file     Objective: Filed Vitals:   03/22/12 1001  BP: 122/80  Pulse: 80    General: Alert and Oriented, No Acute Distress HEENT: Pupils equal, round, reactive to light. Conjunctivae clear.  External ears unremarkable, canals clear with intact TMs with appropriate landmarks.  Middle ear appears open without effusion. Pink inferior turbinates.  Moist mucous membranes, pharynx without inflammation nor lesions.  Neck supple without palpable lymphadenopathy nor abnormal masses. Lungs: Clear to auscultation bilaterally, no  wheezing/ronchi/rales.  Comfortable work of breathing. Good air movement. Cardiac: Regular rate and rhythm. Normal S1/S2.  No murmurs, rubs, nor gallops.   Abdomen:  soft nontender to palpation no palpable masses  Extremities: No peripheral edema.  Strong peripheral pulses.  Mental Status: No depression, anxiety, nor  agitation.Pleasant, good eye contact, normal thought process  Skin: Warm and dry.  Assessment & Plan: Tilda was seen today for medication refills and immunizations.  Diagnoses and associated orders for this visit:  Constipation - docusate sodium (COLACE) 100 MG capsule; Take 1 capsule (100 mg total) by mouth 2 (two) times daily as needed for constipation.  Obstructive chronic bronchitis with exacerbation - albuterol-ipratropium (COMBIVENT) 18-103 MCG/ACT inhaler; Inhale 2 puffs into the lungs every 4 (four) hours as needed for wheezing or shortness of breath. - tiotropium (SPIRIVA HANDIHALER) 18 MCG inhalation capsule; Place 1 capsule (18 mcg total) into inhaler and inhale daily.  Hypertension, benign essential - hydrochlorothiazide (HYDRODIURIL) 25 MG tablet; Take 1 tablet (25 mg total) by mouth daily.  Dyslipidemia - pravastatin (PRAVACHOL) 40 MG tablet; Take 1 tablet by mouth every night at bedtime - Lipid panel  Panic disorder - ALPRAZolam (XANAX) 0.25 MG tablet; Take 1 tablet (0.25 mg total) by mouth 2 (two) times daily as needed for sleep.  Depression - venlafaxine XR (EFFEXOR-XR) 150 MG 24 hr capsule; Take 1 capsule (150 mg total) by mouth daily.  Migraine - topiramate (TOPAMAX) 100 MG tablet; Take 1 tablet (100 mg total) by mouth 2 (two) times daily.  Need for prophylactic vaccination and inoculation against influenza - Flu vaccine greater than or equal to 3yo preservative free IM  Need for prophylactic immunotherapy - Pneumococcal polysaccharide vaccine 23-valent greater than or equal to 2yo subcutaneous/IM    Constipation: Discussed MiraLax versus docusate, she would prefer docusate. Discussed staying well-hydrated and benefits of exercise Hypertension: Controlled continue hydrochlorothiazide Dyslipidemia: Clinically stable however she's overdue for a lipid panel, will adjust Pravachol as needed Panic disorder: Stable continue Xanax use, [place] controlled  substance database reviewed Depression: Stable continue Effexor Migraine: Stable, continue preventative therapy with Topamax COPD: Uncontrolled, starts spiriva, received flu shot and Pneumovax today  40 minutes spent face-to-face during visit today of which at least 50% was counseling or coordinating care.   Return in about 2 weeks (around 04/05/2012). Primarily to continue discussion on constipation and COPD

## 2012-03-23 LAB — LIPID PANEL
Cholesterol: 203 mg/dL — ABNORMAL HIGH (ref 0–200)
HDL: 57 mg/dL (ref >39)
Total CHOL/HDL Ratio: 3.6 Ratio
VLDL: 32 mg/dL (ref 0–40)

## 2012-03-24 ENCOUNTER — Encounter: Payer: Self-pay | Admitting: Family Medicine

## 2012-03-24 DIAGNOSIS — E785 Hyperlipidemia, unspecified: Secondary | ICD-10-CM

## 2012-04-11 ENCOUNTER — Ambulatory Visit (INDEPENDENT_AMBULATORY_CARE_PROVIDER_SITE_OTHER): Payer: BC Managed Care – PPO | Admitting: Family Medicine

## 2012-04-11 ENCOUNTER — Encounter: Payer: Self-pay | Admitting: Family Medicine

## 2012-04-11 VITALS — BP 122/76 | HR 88 | Ht 64.0 in | Wt 165.0 lb

## 2012-04-11 DIAGNOSIS — F3289 Other specified depressive episodes: Secondary | ICD-10-CM

## 2012-04-11 DIAGNOSIS — K5900 Constipation, unspecified: Secondary | ICD-10-CM

## 2012-04-11 DIAGNOSIS — F32A Depression, unspecified: Secondary | ICD-10-CM

## 2012-04-11 DIAGNOSIS — J449 Chronic obstructive pulmonary disease, unspecified: Secondary | ICD-10-CM

## 2012-04-11 DIAGNOSIS — F329 Major depressive disorder, single episode, unspecified: Secondary | ICD-10-CM

## 2012-04-11 DIAGNOSIS — J4489 Other specified chronic obstructive pulmonary disease: Secondary | ICD-10-CM

## 2012-04-11 MED ORDER — VENLAFAXINE HCL ER 75 MG PO CP24: 75.0000 mg | ORAL_CAPSULE | Freq: Every day | ORAL | Status: DC

## 2012-04-11 NOTE — Progress Notes (Signed)
CC: Rose [name] is a 47 y.o. female is here for Follow-up   Subjective: HPI:  COPD: She continues on Combivent, we had aspirated at her last visit and she notices her cough is much less frequent or severe. Describes cough as mild. Shortness of breath has improved as well now described as mild. Now can climb a flight of stairs without getting short of breath. Denies fevers, chills, wheezing, chest pain, chest heaviness. Continues to smoke  Constipation: She reports improvement but only taking Colace once a day. She reports bowel movements about every week. She's pessimistic that she'll be able to take this twice a day do to her hectic life. She denies painful bowel movements, abdominal pain, nausea, vomiting, blood or tar-like appearance to personal. Denies unintentional weight loss or loss of appetite.  Depression: Feels this is getting worse as her daughter recently wrecked a family car and ran away with a drug addict significant other.  She's left HER-2 children to be cared for by their biologic father and the patient.  Patient has no thoughts of wanting to harm herself or others but this is causing great outlook on life and is adding to stress within the family. She describes her status as moderate, similar to that before starting Effexor. Having trouble sleeping, but denies mental disturbance otherwise. Symptoms present all hours of the day. Nothing seems to make it better or worse    Review Of Systems Outlined In HPI  Past Medical History  Diagnosis Date  . COPD 05/24/2007    Qualifier: Diagnosis of  By: Thomos Lemons    . DEPRESSION 05/24/2007    Qualifier: Diagnosis of  By: Thomos Lemons    . PANIC DISORDER 04/18/2007    Qualifier: Diagnosis of  By: Yetta Barre MD, Lauren       No family history on file.   History  Substance Use Topics  . Smoking status: Current Every Day Smoker -- 0.5 packs/day    Types: Cigarettes  . Smokeless tobacco: Not on file  . Alcohol Use: Not on  file     Objective: Filed Vitals:   04/11/12 1001  BP: 122/76  Pulse: 88    General: Alert and Oriented, No Acute Distress HEENT: Pupils equal, round, reactive to light. Conjunctivae clear. Moist mucous membranes, pharynx without inflammation nor lesions.  Neck supple without palpable lymphadenopathy nor abnormal masses. Lungs: Clear to auscultation bilaterally, no wheezing/ronchi/rales.  Comfortable work of breathing. Good air movement. Cardiac: Regular rate and rhythm. Normal S1/S2.  No murmurs, rubs, nor gallops.   Extremities: No peripheral edema.  Strong peripheral pulses.  Mental Status: Mild tearfulness but no anxiety, nor agitation. Skin: Warm and dry.  Assessment & Plan: Rose [name] was seen today for follow-up.  Diagnoses and associated orders for this visit:  Copd  Constipation  Depression - venlafaxine XR (EFFEXOR XR) 75 MG 24 hr capsule; Take 1 capsule (75 mg total) by mouth daily. Take along with 150mg  tablet.    COPD: Stable and improved, continue current inhaler regimen. Constipation: Improved, discussed the goal of having a bowel movement daily or every 2 days, asked her to increase Colace to twice a day. Depression: Deteriorated, increase Effexor to maximum dosage, she declines behavioral health referral as she prefer to continue to use her pastor for social support.  25 minutes spent face-to-face during visit today of which at least 50% was counseling or coordinating care regarding depression, COPD, constipation.   Return in about 4 weeks (around 05/09/2012).

## 2012-04-25 ENCOUNTER — Encounter: Payer: Self-pay | Admitting: Family Medicine

## 2012-04-25 ENCOUNTER — Ambulatory Visit (INDEPENDENT_AMBULATORY_CARE_PROVIDER_SITE_OTHER): Payer: BC Managed Care – PPO | Admitting: Family Medicine

## 2012-04-25 VITALS — BP 133/81 | HR 75 | Temp 98.1°F | Wt 169.0 lb

## 2012-04-25 DIAGNOSIS — J441 Chronic obstructive pulmonary disease with (acute) exacerbation: Secondary | ICD-10-CM

## 2012-04-25 MED ORDER — BUDESONIDE-FORMOTEROL FUMARATE 160-4.5 MCG/ACT IN AERO: 2.0000 | INHALATION_SPRAY | Freq: Two times a day (BID) | RESPIRATORY_TRACT | Status: DC

## 2012-04-25 MED ORDER — DOXYCYCLINE HYCLATE 100 MG PO TABS: ORAL_TABLET | ORAL | Status: AC

## 2012-04-25 NOTE — Progress Notes (Signed)
CC: MEI SUITS is a 47 y.o. female is here for Cough and Nasal Congestion   Subjective: HPI:  Patient reports 3 days of worsening cough. Is described as moderate and dry. It is present 24 hours a day and keeps her awake at night. It seems to gradually be getting worse on a daily basis. Symptoms improved greatly with albuterol but return in a matter of minutes. Nothing else seems to make it better or worse. Has been associated with fatigue and chills. She denies shortness of breath, chest pain, irregular heartbeat, orthopnea, peripheral edema, nausea, vomiting, abdominal pain, sore throat, sinus pressure, dysphagia.  Review Of Systems Outlined In HPI  Past Medical History  Diagnosis Date  . COPD 05/24/2007    Qualifier: Diagnosis of  By: Thomos Lemons    . DEPRESSION 05/24/2007    Qualifier: Diagnosis of  By: Thomos Lemons    . PANIC DISORDER 04/18/2007    Qualifier: Diagnosis of  By: Yetta Barre MD, Lauren       No family history on file.   History  Substance Use Topics  . Smoking status: Current Every Day Smoker -- 0.5 packs/day    Types: Cigarettes  . Smokeless tobacco: Not on file  . Alcohol Use: Not on file     Objective: Filed Vitals:   04/25/12 1013  BP: 133/81  Pulse: 75  Temp: 98.1 F (36.7 C)    General: Alert and Oriented, No Acute Distress HEENT: Pupils equal, round, reactive to light. Conjunctivae clear.  External ears unremarkable, canals clear with intact TMs with appropriate landmarks.  Middle ear appears open without effusion. Pink inferior turbinates.  Moist mucous membranes, pharynx without inflammation nor lesions.  Neck supple without palpable lymphadenopathy nor abnormal masses. Lungs: Clear comfortable breathing, frequent coughing, mild to moderate rhonchi in all lung fields without wheezing nor rales Cardiac: Regular rate and rhythm. Normal S1/S2.  No murmurs, rubs, nor gallops.   Extremities: No peripheral edema.  Strong peripheral pulses.  Mental  Status: No depression, anxiety, nor agitation. Skin: Warm and dry.  Assessment & Plan: Jadamarie was seen today for cough and nasal congestion.  Diagnoses and associated orders for this visit:  Copd exacerbation - doxycycline (VIBRA-TABS) 100 MG tablet; One by mouth twice a day for ten days.  Other Orders - budesonide-formoterol (SYMBICORT) 160-4.5 MCG/ACT inhaler; Inhale 2 puffs into the lungs 2 (two) times daily.    COPD exacerbation: Start doxycycline, given lack of wheezing we'll steer clear from prednisone, we'll schedule nebulizer every 4-6 hours while awake for the next 48 hours. If wheezing occurs despite this we will call in prednisone. I also like her to start Symbicort to help prevent future exacerbations.Signs and symptoms requring emergent/urgent reevaluation were discussed with the patient.  Return in about 2 weeks (around 05/09/2012).

## 2012-04-29 ENCOUNTER — Ambulatory Visit (INDEPENDENT_AMBULATORY_CARE_PROVIDER_SITE_OTHER): Payer: BC Managed Care – PPO | Admitting: Family Medicine

## 2012-04-29 ENCOUNTER — Encounter: Payer: Self-pay | Admitting: Family Medicine

## 2012-04-29 ENCOUNTER — Ambulatory Visit (HOSPITAL_BASED_OUTPATIENT_CLINIC_OR_DEPARTMENT_OTHER)
Admission: RE | Admit: 2012-04-29 | Discharge: 2012-04-29 | Disposition: A | Discharge status: Home / Self Care | Payer: BC Managed Care – PPO | Source: Ambulatory Visit | Attending: Family Medicine | Admitting: Family Medicine

## 2012-04-29 VITALS — BP 122/84 | HR 79 | Temp 98.0°F | Wt 163.0 lb

## 2012-04-29 DIAGNOSIS — R110 Nausea: Secondary | ICD-10-CM

## 2012-04-29 DIAGNOSIS — R1011 Right upper quadrant pain: Secondary | ICD-10-CM

## 2012-04-29 MED ORDER — PROMETHAZINE HCL 50 MG PO TABS: ORAL_TABLET | ORAL | Status: DC

## 2012-04-29 NOTE — Progress Notes (Signed)
CC: Rose [name] is a 47 y.o. female is here for Abdominal Pain   Subjective: HPI:  Patient reports right upper quadrant pain that's been present for 2 days. It came on gradually and is described as a"sharp and catching"discomfort that often radiates into the back, around the side to the right and also to the right shoulder blade. It is moderate in severity after eating, mild in severity with an empty stomach. Nothing else seems to make it better or worse. Seems it is more noticeable and worsening on a daily basis. She's had similar episodes in the remote past but these went away without intervention and after a few hours. It has been associated with nausea accompanied by the pain, increased belching, increased flatulence. She had a bowel movement yesterday that did not help or worsen the pain. She denies diarrhea but chronically only has one bowel movement every week.  She denies fevers, chills, skin or scleral discoloration, jaundice, vomiting, skin changes, blisters or rashes on the skin, confusion, nor motor or sensory disturbances   Review Of Systems Outlined In HPI  Past Medical History  Diagnosis Date  . COPD 05/24/2007    Qualifier: Diagnosis of  By: Thomos Lemons    . DEPRESSION 05/24/2007    Qualifier: Diagnosis of  By: Thomos Lemons    . PANIC DISORDER 04/18/2007    Qualifier: Diagnosis of  By: Yetta Barre MD, Lauren       No family history on file.   History  Substance Use Topics  . Smoking status: Current Every Day Smoker -- 0.5 packs/day    Types: Cigarettes  . Smokeless tobacco: Not on file  . Alcohol Use: Not on file     Objective: Filed Vitals:   04/29/12 1110  BP: 122/84  Pulse: 79  Temp: 98 F (36.7 C)    General: Alert and Oriented, No Acute Distress HEENT: Pupils equal, round, reactive to light. Conjunctivae clear. Moist mucous membranes Lungs: Clear to auscultation bilaterally, no wheezing/ronchi/rales.  Comfortable work of breathing. Good air  movement. Cardiac: Regular rate and rhythm. Normal S1/S2.  No murmurs, rubs, nor gallops.   Abdomen: Soft and obese, no rebound but positive Murphy sign and no other abdominal tenderness MSK: Pain is slightly reproduced with lateral compression of the chest wall  Extremities: No peripheral edema.  Strong peripheral pulses.  Mental Status: No depression, anxiety, nor agitation. Skin: Warm and dry. No skin abnormalities at the site of her pain  Assessment & Plan: Rose [name] was seen today for abdominal pain.  Diagnoses and associated orders for this visit:  Ruq pain - US Abdomen Limited RUQ; Future - COMPLETE METABOLIC PANEL WITH GFR - Gamma GT - Lipase  Nausea - US Abdomen Limited RUQ; Future - COMPLETE METABOLIC PANEL WITH GFR - Gamma GT - Lipase    Moderate to high suspicion of cholelithiasis causing retrocardiac pain and nausea, she will get a abdominal ultrasound at 2:30 this afternoon, obtaining the above labs to rule out any other GI pathology.  Discussed with patient that ultrasound is unremarkable, and what I gather question that her discomfort is due to muscular strain given her severe coughing earlier this week. Followup will be determined based on above results this afternoon   Ultrasound showed no abnormalities, patient was called and plan the weekend was control nausea with Phenergan start twice a day MiraLax regimen for suspicion of constipation, over-the-counter anti-inflammatories. Will follow with patient on Monday with labs are back

## 2012-04-30 LAB — COMPLETE METABOLIC PANEL WITH GFR
AST: 14 U/L (ref 0–37)
Albumin: 4.3 g/dL (ref 3.5–5.2)
Alkaline Phosphatase: 116 U/L (ref 39–117)
BUN: 10 mg/dL (ref 6–23)
GFR, Est Non African American: 76 mL/min
Glucose, Bld: 76 mg/dL (ref 70–99)
Potassium: 3.8 mEq/L (ref 3.5–5.3)
Sodium: 145 mEq/L (ref 135–145)
Total Bilirubin: 0.3 mg/dL (ref 0.3–1.2)

## 2012-04-30 LAB — GAMMA GT: GGT: 17 U/L (ref 7–51)

## 2012-04-30 LAB — LIPASE: Lipase: 10 U/L (ref 0–75)

## 2012-07-08 ENCOUNTER — Telehealth: Payer: Self-pay | Admitting: Family Medicine

## 2012-07-08 DIAGNOSIS — F410 Panic disorder [episodic paroxysmal anxiety] without agoraphobia: Secondary | ICD-10-CM

## 2012-07-08 MED ORDER — ALPRAZOLAM 0.25 MG PO TABS: 0.2500 mg | ORAL_TABLET | Freq: Two times a day (BID) | ORAL | Status: DC | PRN

## 2012-07-08 NOTE — Telephone Encounter (Signed)
Pharm request.

## 2012-09-05 ENCOUNTER — Ambulatory Visit (INDEPENDENT_AMBULATORY_CARE_PROVIDER_SITE_OTHER): Payer: BC Managed Care – PPO | Admitting: Family Medicine

## 2012-09-05 ENCOUNTER — Encounter: Payer: Self-pay | Admitting: Family Medicine

## 2012-09-05 VITALS — BP 123/77 | HR 88 | Wt 167.0 lb

## 2012-09-05 DIAGNOSIS — K5904 Chronic idiopathic constipation: Secondary | ICD-10-CM | POA: Insufficient documentation

## 2012-09-05 DIAGNOSIS — K5900 Constipation, unspecified: Secondary | ICD-10-CM

## 2012-09-05 DIAGNOSIS — B3789 Other sites of candidiasis: Secondary | ICD-10-CM

## 2012-09-05 DIAGNOSIS — Z1239 Encounter for other screening for malignant neoplasm of breast: Secondary | ICD-10-CM

## 2012-09-05 HISTORY — DX: Chronic idiopathic constipation: K59.04

## 2012-09-05 MED ORDER — LUBIPROSTONE 24 MCG PO CAPS: 24.0000 ug | ORAL_CAPSULE | Freq: Two times a day (BID) | ORAL | Status: DC

## 2012-09-05 MED ORDER — NYSTATIN 100000 UNIT/GM EX POWD: CUTANEOUS | Status: DC

## 2012-09-05 NOTE — Progress Notes (Signed)
CC: Rose [name] is a 47 y.o. female is here for f/u constipation   Subjective: HPI:  Patient complains of worsening chronic constipation. Constipation has been present for over 3 years a daily basis. She reports defecating once every week or 2 weeks described as difficult to pass, hard, requiring significant straining. She has tried MiraLax, Metamucil, docusate, magnesium citrate, all of these separately for at least a month on a daily basis without any improvement of her symptoms.  She reports daily abdominal cramping that roves throughout the day slightly improved with defecation somewhat worse when eating large meals. She denies unintentional weight loss, vomiting, abdominal pain waking from sleep, back pain, nor rectal bleeding  Patient is requesting referral for mammogram, mother diagnosed in her 28s. Patient has implants and required diagnostic 2009 but results show only screening would be needed for followup. She denies any breast architectural changes but does report itchiness and redness under both breasts the more she sweats.   Review Of Systems Outlined In HPI  Past Medical History  Diagnosis Date  . COPD 05/24/2007    Qualifier: Diagnosis of  By: Thomos Lemons    . DEPRESSION 05/24/2007    Qualifier: Diagnosis of  By: Thomos Lemons    . PANIC DISORDER 04/18/2007    Qualifier: Diagnosis of  By: Yetta Barre MD, Lauren       Family History  Problem Relation Age of Onset  . Breast cancer Mother   . Prostate cancer Father      History  Substance Use Topics  . Smoking status: Current Every Day Smoker -- 0.50 packs/day    Types: Cigarettes  . Smokeless tobacco: Not on file  . Alcohol Use: Not on file     Objective: Filed Vitals:   09/05/12 1404  BP: 123/77  Pulse: 88    Vital signs reviewed. General: Alert and Oriented, No Acute Distress HEENT: Pupils equal, round, reactive to light. Conjunctivae clear.  External ears unremarkable.  Moist mucous membranes. Lungs:  Clear and comfortable work of breathing, speaking in full sentences without accessory muscle use. Cardiac: Regular rate and rhythm.  Abdomen: Mild obesity, soft nontender without rebound Neuro: CN II-XII grossly intact, gait normal. Extremities: No peripheral edema.  Strong peripheral pulses.  Mental Status: No depression, anxiety, nor agitation. Logical though process. Skin: Warm and dry.  Assessment & Plan: Rose [name] was seen today for f/u constipation.  Diagnoses and associated orders for this visit:  Chronic idiopathic constipation - lubiprostone (AMITIZA) 24 MCG capsule; Take 1 capsule (24 mcg total) by mouth 2 (two) times daily with a meal.  Screening for breast cancer - MS Digital Screening W/ Implants; Future  Candidiasis of breast - nystatin (MYCOSTATIN) powder; Apply to affected area 3 times daily until itching gone.    Chronic idiopathic constipation: Chronic uncontrolled condition. Step of therapy with amitiza samples given for one week as I expect a prior authorization needed Trial of nystatin for presumed breast candidiasis, use as needed mammogram screening placed  Return in about 4 weeks (around 10/03/2012).

## 2012-09-13 ENCOUNTER — Encounter (HOSPITAL_COMMUNITY): Payer: BC Managed Care – PPO

## 2012-09-13 ENCOUNTER — Ambulatory Visit: Payer: BC Managed Care – PPO

## 2012-09-13 DIAGNOSIS — Z1239 Encounter for other screening for malignant neoplasm of breast: Secondary | ICD-10-CM

## 2012-10-06 ENCOUNTER — Other Ambulatory Visit: Payer: Self-pay | Admitting: *Deleted

## 2012-10-06 DIAGNOSIS — K5900 Constipation, unspecified: Secondary | ICD-10-CM

## 2012-10-06 MED ORDER — DOCUSATE SODIUM 100 MG PO CAPS: 100.0000 mg | ORAL_CAPSULE | Freq: Two times a day (BID) | ORAL | Status: AC | PRN

## 2012-10-20 ENCOUNTER — Encounter: Payer: Self-pay | Admitting: Family Medicine

## 2012-10-20 ENCOUNTER — Ambulatory Visit (INDEPENDENT_AMBULATORY_CARE_PROVIDER_SITE_OTHER): Payer: BC Managed Care – PPO | Admitting: Family Medicine

## 2012-10-20 VITALS — BP 125/90 | HR 116 | Temp 98.2°F | Wt 167.0 lb

## 2012-10-20 DIAGNOSIS — F410 Panic disorder [episodic paroxysmal anxiety] without agoraphobia: Secondary | ICD-10-CM

## 2012-10-20 DIAGNOSIS — J441 Chronic obstructive pulmonary disease with (acute) exacerbation: Secondary | ICD-10-CM

## 2012-10-20 MED ORDER — DOXYCYCLINE HYCLATE 100 MG PO TABS: ORAL_TABLET | ORAL | Status: AC

## 2012-10-20 MED ORDER — ALPRAZOLAM 0.25 MG PO TABS: 0.2500 mg | ORAL_TABLET | Freq: Two times a day (BID) | ORAL | Status: DC | PRN

## 2012-10-20 MED ORDER — METHYLPREDNISOLONE SODIUM SUCC 125 MG IJ SOLR: 125.0000 mg | Freq: Once | INTRAMUSCULAR | Status: DC

## 2012-10-20 MED ORDER — METHYLPREDNISOLONE SODIUM SUCC 125 MG IJ SOLR
125.0000 mg | Freq: Once | INTRAMUSCULAR | Status: AC
  Administered 2012-10-20: 125 mg via INTRAMUSCULAR

## 2012-10-20 MED ORDER — CITALOPRAM HYDROBROMIDE 20 MG PO TABS: 20.0000 mg | ORAL_TABLET | Freq: Every day | ORAL | Status: DC

## 2012-10-20 MED ORDER — PREDNISONE 20 MG PO TABS: ORAL_TABLET | ORAL | Status: AC

## 2012-10-20 NOTE — Progress Notes (Signed)
CC: Rose [name] is a 47 y.o. female is here for Cough   Subjective: HPI:  Complains of productive cough moderate severity worsening for the past 2-3 weeks. Present all hours of the day improves with albuterol nothing else makes better or worse. Comes in waves she has vomited once posttussive emesis. Shortness of breath occurring only with coughing. Denies blood in sputum. There is chest pain localized right low lateral wall present when coughing. No exertional chest pain. Denies orthopnea, fevers, chills, nausea, abdominal pain, confusion, headaches.  Patient has decided to stop most of her medications. She is no longer taking Effexor she feels like she is been more snappy with her coworkers. She would like to take something else other than Effexor if possible symptoms are improved with occasional use of Xanax. She reports an anxiety component. Symptoms are currently mild to moderate. Presently daily basis.  Denies depression, paranoia, mental disturbance other than the above   Review Of Systems Outlined In HPI  Past Medical History  Diagnosis Date  . COPD 05/24/2007    Qualifier: Diagnosis of  By: Thomos Lemons    . DEPRESSION 05/24/2007    Qualifier: Diagnosis of  By: Thomos Lemons    . PANIC DISORDER 04/18/2007    Qualifier: Diagnosis of  By: Yetta Barre MD, Lauren       Family History  Problem Relation Age of Onset  . Breast cancer Mother   . Prostate cancer Father      History  Substance Use Topics  . Smoking status: Current Every Day Smoker -- 0.50 packs/day    Types: Cigarettes  . Smokeless tobacco: Not on file  . Alcohol Use: Not on file     Objective: Filed Vitals:   10/20/12 1044  BP: 125/90  Pulse: 116  Temp: 98.2 F (36.8 C)    General: Alert and Oriented, No Acute Distress HEENT: Pupils equal, round, reactive to light. Conjunctivae clear.  External ears unremarkable, canals clear with intact TMs with appropriate landmarks.  Middle ear appears open without  effusion. Pink inferior turbinates.  Moist mucous membranes, pharynx without inflammation nor lesions.  Neck supple without palpable lymphadenopathy nor abnormal masses. Lungs: Diffuse end expiratory wheezing, no rhonchi no rails no signs of consolidation. Good air movement. Frequent coughing Chest: Mild tenderness to palpation of intercostal space between right eighth and ninth rib Cardiac: Regular rate and rhythm. Normal S1/S2.  No murmurs, rubs, nor gallops.   Extremities: No peripheral edema.  Strong peripheral pulses.  Mental Status: No depression, anxiety, nor agitation. Skin: Warm and dry.  Assessment & Plan: Rose [name] was seen today for cough.  Diagnoses and associated orders for this visit:  CHRONIC OBSTRUCTIVE PULMONARY DISEASE, ACUTE EXACERBATION - doxycycline (VIBRA-TABS) 100 MG tablet; One by mouth twice a day for ten days. - predniSONE (DELTASONE) 20 MG tablet; Three tabs daily days 1-3, two tabs daily days 4-6, one tab daily days 7-9, half tab daily days 10-13.  PANIC DISORDER - ALPRAZolam (XANAX) 0.25 MG tablet; Take 1 tablet (0.25 mg total) by mouth 2 (two) times daily as needed for sleep. - citalopram (CELEXA) 20 MG tablet; Take 1 tablet (20 mg total) by mouth daily.    COPD exacerbation: I've encouraged her to continue albuterol every 4 hours scheduled for the next 48 hours, start doxycycline and prednisone taper she will receive Solu-Medrol before leaving to last her until she picks up prednisone tonight. She declines breathing treatment today. Panic disorder with anxiety: Uncontrolled she did not  respond to Zoloft in the past she has never tried citalopram we will start this in followup in 4 weeks.  Return in about 4 weeks (around 11/17/2012) for Anxiety and Depression F/U.

## 2012-11-16 ENCOUNTER — Encounter: Payer: Self-pay | Admitting: Family Medicine

## 2012-11-16 ENCOUNTER — Ambulatory Visit (INDEPENDENT_AMBULATORY_CARE_PROVIDER_SITE_OTHER): Payer: BC Managed Care – PPO | Admitting: Family Medicine

## 2012-11-16 VITALS — BP 124/78 | HR 71 | Wt 172.0 lb

## 2012-11-16 DIAGNOSIS — R5381 Other malaise: Secondary | ICD-10-CM

## 2012-11-16 DIAGNOSIS — R5383 Other fatigue: Secondary | ICD-10-CM

## 2012-11-16 DIAGNOSIS — K5904 Chronic idiopathic constipation: Secondary | ICD-10-CM

## 2012-11-16 DIAGNOSIS — F410 Panic disorder [episodic paroxysmal anxiety] without agoraphobia: Secondary | ICD-10-CM

## 2012-11-16 DIAGNOSIS — K5900 Constipation, unspecified: Secondary | ICD-10-CM

## 2012-11-16 DIAGNOSIS — F329 Major depressive disorder, single episode, unspecified: Secondary | ICD-10-CM

## 2012-11-16 MED ORDER — LINACLOTIDE 145 MCG PO CAPS: 145.0000 ug | ORAL_CAPSULE | Freq: Every day | ORAL | Status: DC

## 2012-11-16 MED ORDER — BUPROPION HCL ER (XL) 150 MG PO TB24: 150.0000 mg | ORAL_TABLET | ORAL | Status: DC

## 2012-11-16 NOTE — Progress Notes (Signed)
CC: Rose [name] is a 47 y.o. female is here for Fatigue   Subjective: HPI:  Patient was in moderate to severe fatigue that has been present since the middle of June days after starting citalopram symptoms are present on a daily basis nothing makes them better or worse. 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 There are persistent throughout the day. Symptoms have not gotten better or worse since gradual onset the first week. They've been associated with subjective depression lack of interest in all hobbies and interests.  She denies anxiety or mental disturbance other than above. Denies fevers, chills, shortness of breath, chest pain, nausea, vomiting, abdominal pain  Patient complains of continued constipation she can go 5-7 days without a bowel movement. 3 weeks out of the month she will have this behavior with one week of a bowel movement every other day. There's been no change to daily activities or medications since this is started at least over a year ago.  The longer she goes without a bowel movement the more abdominal bloating she feels this is relieved with bowel movement. She denies blood in stool nor tar appearance of stool she denies unintentional weight gain or loss. MiraLax, docusate and other over-the-counter laxatives have not been of benefit   Review Of Systems Outlined In HPI  Past Medical History  Diagnosis Date  . COPD 05/24/2007    Qualifier: Diagnosis of  By: Thomos Lemons    . DEPRESSION 05/24/2007    Qualifier: Diagnosis of  By: Thomos Lemons    . PANIC DISORDER 04/18/2007    Qualifier: Diagnosis of  By: Yetta Barre MD, Lauren       Family History  Problem Relation Age of Onset  . Breast cancer Mother   . Prostate cancer Father      History  Substance Use Topics  . Smoking status: Current Every Day Smoker -- 0.50 packs/day    Types: Cigarettes  . Smokeless tobacco: Not on file  .  Alcohol Use: Not on file     Objective: Filed Vitals:   11/16/12 1131  BP: 124/78  Pulse: 71    General: Alert and Oriented, No Acute Distress HEENT: Pupils equal, round, reactive to light. Conjunctivae clear.  External ears unremarkable, canals clear with intact TMs with appropriate landmarks.  Middle ear appears open without effusion. Pink inferior turbinates.  Moist mucous membranes, pharynx without inflammation nor lesions.  Neck supple without palpable lymphadenopathy nor abnormal masses. Lungs: Clear to auscultation bilaterally, no wheezing/ronchi/rales.  Comfortable work of breathing. Good air movement. Cardiac: Regular rate and rhythm. Normal S1/S2.  No murmurs, rubs, nor gallops.   Abdomen: Soft nontender Extremities: No peripheral edema.  Strong peripheral pulses.  Mental Status: No depression, anxiety, nor agitation. Skin: Warm and dry.  Assessment & Plan: [name] was seen today for fatigue.  Diagnoses and associated orders for this visit:  PANIC DISORDER  DEPRESSION - buPROPion (WELLBUTRIN XL) 150 MG 24 hr tablet; Take 1 tablet (150 mg total) by mouth every morning. To help with mood.  Fatigue - B12 - TSH - CBC  Constipation  Chronic idiopathic constipation - Linaclotide (LINZESS) 145 MCG CAPS capsule; Take 1 capsule (145 mcg total) by mouth daily.    Fatigue: I do suspect her fatigue possibly as a side effect of citalopram but also uncontrolled depression I would like her to switch to Wellbutrin stop citalopram. We will also look into  the labs above for reversible causes of her fatigue. Chronic idiopathic constipation chronic uncontrolled condition: She was given a sample of linzess for the next month as a trial we will continue this indefinitely if beneficial  Return in about 4 weeks (around 12/14/2012).

## 2012-11-22 ENCOUNTER — Telehealth: Payer: Self-pay | Admitting: Family Medicine

## 2012-11-22 DIAGNOSIS — H547 Unspecified visual loss: Secondary | ICD-10-CM

## 2012-11-22 LAB — CBC
Hemoglobin: 13.8 g/dL (ref 12.0–15.0)
MCH: 31.8 pg (ref 26.0–34.0)
RBC: 4.34 MIL/uL (ref 3.87–5.11)

## 2012-11-22 LAB — VITAMIN B12: Vitamin B-12: 446 pg/mL (ref 211–911)

## 2012-11-22 LAB — TSH: TSH: 1.066 u[IU]/mL (ref 0.350–4.500)

## 2012-11-22 LAB — SEDIMENTATION RATE: Sed Rate: 27 mm/hr — ABNORMAL HIGH (ref 0–22)

## 2012-11-22 LAB — C-REACTIVE PROTEIN: CRP: 1 mg/dL — ABNORMAL HIGH (ref <0.60)

## 2012-11-22 NOTE — Telephone Encounter (Signed)
Per request of [facility]

## 2012-11-24 ENCOUNTER — Encounter: Payer: Self-pay | Admitting: Family Medicine

## 2012-11-24 ENCOUNTER — Ambulatory Visit (INDEPENDENT_AMBULATORY_CARE_PROVIDER_SITE_OTHER): Payer: BC Managed Care – PPO | Admitting: Family Medicine

## 2012-11-24 VITALS — BP 116/75 | HR 77 | Wt 170.0 lb

## 2012-11-24 DIAGNOSIS — M353 Polymyalgia rheumatica: Secondary | ICD-10-CM

## 2012-11-24 MED ORDER — PREDNISONE 20 MG PO TABS: 20.0000 mg | ORAL_TABLET | Freq: Every day | ORAL | Status: DC

## 2012-11-24 NOTE — Progress Notes (Signed)
CC: Rose [name] is a 47 y.o. female is here for discuss lab results   Subjective: HPI:  Followup fatigue: Since stopping Celexa starting Wellbutrin she has not noticed any improvement her fatigue. Interestingly she had either a venous hemorrhage or arterial hemorrhage in her right eye her eye doctor requested CRP and ESR to be obtained to rule out giant cell arteritis these both returned slightly elevated in the setting of her being in her regular state of health other than above. TSH B12 and hemoglobin have been normal. She describes fatigue lasting throughout the day severe in severity she also reports she cannot clarify stairs without having proximal leg weakness where she has to stop she has extreme difficulty combing her hair due to arm weakness. She denies fevers but she's unsure about morning stiffness. Denies shortness of breath, cough, abdominal pain, diarrhea, constipation, nausea, appetite loss nor headache. Denies jaw claudication   Review Of Systems Outlined In HPI  Past Medical History  Diagnosis Date  . COPD 05/24/2007    Qualifier: Diagnosis of  By: Thomos Lemons    . DEPRESSION 05/24/2007    Qualifier: Diagnosis of  By: Thomos Lemons    . PANIC DISORDER 04/18/2007    Qualifier: Diagnosis of  By: Yetta Barre MD, Lauren       Family History  Problem Relation Age of Onset  . Breast cancer Mother   . Prostate cancer Father      History  Substance Use Topics  . Smoking status: Current Every Day Smoker -- 0.50 packs/day    Types: Cigarettes  . Smokeless tobacco: Not on file  . Alcohol Use: Not on file     Objective: Filed Vitals:   11/24/12 0952  BP: 116/75  Pulse: 77    General: Alert and Oriented, No Acute Distress HEENT: Pupils equal, round, reactive to light. Conjunctivae clear.  Moist mucous membranes pharynx unremarkable. Lungs: Clear to auscultation bilaterally, no wheezing/ronchi/rales.  Comfortable work of breathing. Good air movement. Cardiac:  Regular rate and rhythm. Normal S1/S2.  No murmurs, rubs, nor gallops.   Extremities: No peripheral edema.  Strong peripheral pulses.  Mental Status: No depression, anxiety, nor agitation. Skin: Warm and dry.  Assessment & Plan: Rose [name] was seen today for discuss lab results.  Diagnoses and associated orders for this visit:  Polymyalgia rheumatica - predniSONE (DELTASONE) 20 MG tablet; Take 1 tablet (20 mg total) by mouth daily.    Discussed with patient my strong suspicion for polymyalgia rheumatica she has strong reservations about starting prednisone due to weight gain she is in agreement to trial of 20 mg daily for one week, I would certainly expect her symptoms to significantly improve during that time if this truly is PMR, she's agreed to call next week approximately 7 days for a symptom report.  Fortunately she tells me that linzess has completely resolved her constipation she continues to take this.  Return in about 4 weeks (around 12/22/2012) for Call next week about update.

## 2012-12-01 ENCOUNTER — Telehealth: Payer: Self-pay | Admitting: *Deleted

## 2012-12-01 DIAGNOSIS — M353 Polymyalgia rheumatica: Secondary | ICD-10-CM

## 2012-12-01 NOTE — Telephone Encounter (Signed)
Pt called with an update to let you know that she feels a whole lot better. The fatigue and muscle weakness have improved. Pt wanted to know if she should get another round of prednisone just because her sxs haven't completley resolved and she didn't want it to flare back up over the weekend.Please advise

## 2012-12-01 NOTE — Telephone Encounter (Signed)
Pt called with an update to let you know that she feels a whole lot better. The fatigue and muscle weakness have improved. Pt wanted to know if she should get another round of prednisone just because her sxs haven't completley resolved and she didn't want it to flare back up over the weekend

## 2012-12-02 DIAGNOSIS — M353 Polymyalgia rheumatica: Secondary | ICD-10-CM | POA: Insufficient documentation

## 2012-12-02 HISTORY — DX: Polymyalgia rheumatica: M35.3

## 2012-12-02 MED ORDER — PREDNISONE 20 MG PO TABS: 20.0000 mg | ORAL_TABLET | Freq: Every day | ORAL | Status: DC

## 2012-12-02 NOTE — Telephone Encounter (Signed)
This was started due to a susppicion of polymyalgia rheumatica, great to hear that symptoms improved.  If this is truly polymyalgia rheumatica prednisone will need to be taken daily for approx 3-4 months with a slow taper.  I'd encourage Rose [name] to return in 3-4 weeks to go over a taper plan.  At lease two months of prednisone has been sent to [facility] on file.

## 2012-12-02 NOTE — Telephone Encounter (Signed)
Left complete detailed message on vm with recommendations.

## 2012-12-07 ENCOUNTER — Ambulatory Visit (INDEPENDENT_AMBULATORY_CARE_PROVIDER_SITE_OTHER): Payer: BC Managed Care – PPO | Admitting: Family Medicine

## 2012-12-07 ENCOUNTER — Encounter: Payer: Self-pay | Admitting: Family Medicine

## 2012-12-07 VITALS — BP 122/75 | HR 81 | Wt 171.0 lb

## 2012-12-07 DIAGNOSIS — F329 Major depressive disorder, single episode, unspecified: Secondary | ICD-10-CM

## 2012-12-07 DIAGNOSIS — B370 Candidal stomatitis: Secondary | ICD-10-CM

## 2012-12-07 DIAGNOSIS — M353 Polymyalgia rheumatica: Secondary | ICD-10-CM

## 2012-12-07 DIAGNOSIS — F32A Depression, unspecified: Secondary | ICD-10-CM

## 2012-12-07 DIAGNOSIS — F3289 Other specified depressive episodes: Secondary | ICD-10-CM

## 2012-12-07 MED ORDER — NYSTATIN 100000 UNIT/ML MT SUSP: 500000.0000 [IU] | Freq: Four times a day (QID) | OROMUCOSAL | Status: DC

## 2012-12-07 MED ORDER — BUPROPION HCL ER (XL) 300 MG PO TB24: 300.0000 mg | ORAL_TABLET | ORAL | Status: DC

## 2012-12-07 NOTE — Progress Notes (Signed)
CC: Rose [name] is a 47 y.o. female is here for discuss increasing wellbutrin   Subjective: HPI:  Followup depression: Patient is that she's having crying spells 2-4 times a day for no particular reason. It occurs both at work and at home they will last minutes. Describes this as mild to moderate in severity it is interfering with quality of life. Has slightly improved since starting Wellbutrin. Present on a daily basis, reports subjective depression now mild in severity improved from moderate since starting Wellbutrin. Denies thoughts wanting to harm herself or others, paranoia, anxiety, nor mental disturbance other than above.  Followup on polymyalgia rheumatica: Patient reports a drastic improvement in proximal muscle soreness and weakness she states this improvement occurred one-2 days after starting prednisone 20 mg if she continues to take on a daily basis. She denies motor or sensory disturbances muscle wasting nor cognitive trouble. Denies fevers, chills, unintentional weight gain/loss nor fatigue.  Patient complains of a white film on her tongue that has been present for 5 days improves with brushing using toothbrush only to return later in the day. She washes her mouth out after using Symbicort. No intervention other than above  Review Of Systems Outlined In HPI  Past Medical History  Diagnosis Date  . COPD 05/24/2007    Qualifier: Diagnosis of  By: Thomos Lemons    . DEPRESSION 05/24/2007    Qualifier: Diagnosis of  By: Thomos Lemons    . PANIC DISORDER 04/18/2007    Qualifier: Diagnosis of  By: Yetta Barre MD, Lauren       Family History  Problem Relation Age of Onset  . Breast cancer Mother   . Prostate cancer Father      History  Substance Use Topics  . Smoking status: Current Every Day Smoker -- 0.50 packs/day    Types: Cigarettes  . Smokeless tobacco: Not on file  . Alcohol Use: Not on file     Objective: Filed Vitals:   12/07/12 1039  BP: 122/75  Pulse: 81     General: Alert and Oriented, No Acute Distress HEENT: Pupils equal, round, reactive to light. Conjunctivae clear.  Moist mucous membranes thin white film on the soft palate and tongue otherwise pharynx unremarkable Lungs: Clear to auscultation bilaterally, no wheezing/ronchi/rales.  Comfortable work of breathing. Good air movement. Cardiac: Regular rate and rhythm. Normal S1/S2.  No murmurs, rubs, nor gallops.   Extremities: No peripheral edema.  Strong peripheral pulses. Full range of motion and strength of both upper and lower extremities Mental Status: No depression, anxiety, nor agitation. Skin: Warm and dry.  Assessment & Plan: Rose [name] was seen today for discuss increasing wellbutrin.  Diagnoses and associated orders for this visit:  Depression  Polymyalgia rheumatica  Thrush - nystatin (MYCOSTATIN) 100000 UNIT/ML suspension; Take 5 mLs (500,000 Units total) by mouth 4 (four) times daily. Swish and swallow for one week.  DEPRESSION - buPROPion (WELLBUTRIN XL) 300 MG 24 hr tablet; Take 1 tablet (300 mg total) by mouth every morning. To help with mood.    Thrush: Likely due to prednisone use use nystatin as needed Polymyalgia rheumatica Significantly improved and controlled, in about 2 weeks if she's still feeling good we will cut down to 50 mg prednisone daily after that will drop 2.5 mg every 2-4 weeks as tolerated Depression: Uncontrolled chronic condition, increasing Wellbutrin formal followup in 4 weeks  Return in about 4 weeks (around 01/04/2013).

## 2012-12-16 ENCOUNTER — Other Ambulatory Visit: Payer: Self-pay | Admitting: Family Medicine

## 2012-12-19 ENCOUNTER — Telehealth: Payer: Self-pay | Admitting: Family Medicine

## 2012-12-19 DIAGNOSIS — K5904 Chronic idiopathic constipation: Secondary | ICD-10-CM

## 2012-12-19 MED ORDER — LINACLOTIDE 145 MCG PO CAPS: 145.0000 ug | ORAL_CAPSULE | Freq: Every day | ORAL | Status: DC

## 2012-12-19 NOTE — Telephone Encounter (Signed)
Refill request

## 2013-01-29 ENCOUNTER — Other Ambulatory Visit: Payer: Self-pay | Admitting: Family Medicine

## 2013-02-09 ENCOUNTER — Other Ambulatory Visit: Payer: Self-pay

## 2013-02-26 ENCOUNTER — Other Ambulatory Visit: Payer: Self-pay | Admitting: Family Medicine

## 2013-02-26 DIAGNOSIS — M353 Polymyalgia rheumatica: Secondary | ICD-10-CM

## 2013-02-28 NOTE — Telephone Encounter (Signed)
Begin taper

## 2013-03-07 ENCOUNTER — Other Ambulatory Visit: Payer: Self-pay | Admitting: *Deleted

## 2013-03-07 DIAGNOSIS — F410 Panic disorder [episodic paroxysmal anxiety] without agoraphobia: Secondary | ICD-10-CM

## 2013-03-07 MED ORDER — ALPRAZOLAM 0.25 MG PO TABS: 0.2500 mg | ORAL_TABLET | Freq: Two times a day (BID) | ORAL | Status: DC | PRN

## 2013-03-18 ENCOUNTER — Other Ambulatory Visit: Payer: Self-pay | Admitting: Family Medicine

## 2013-03-26 ENCOUNTER — Other Ambulatory Visit: Payer: Self-pay | Admitting: Family Medicine

## 2013-03-27 NOTE — Telephone Encounter (Signed)
New prednisone dose sent to [facility] on file.

## 2013-03-31 ENCOUNTER — Other Ambulatory Visit: Payer: Self-pay | Admitting: *Deleted

## 2013-03-31 DIAGNOSIS — I10 Essential (primary) hypertension: Secondary | ICD-10-CM

## 2013-03-31 MED ORDER — HYDROCHLOROTHIAZIDE 25 MG PO TABS: 25.0000 mg | ORAL_TABLET | Freq: Every day | ORAL | Status: DC

## 2013-03-31 MED ORDER — TOPIRAMATE 100 MG PO TABS: ORAL_TABLET | ORAL | Status: DC

## 2013-04-26 ENCOUNTER — Other Ambulatory Visit: Payer: Self-pay | Admitting: Family Medicine

## 2013-04-26 DIAGNOSIS — M353 Polymyalgia rheumatica: Secondary | ICD-10-CM

## 2013-04-26 MED ORDER — PREDNISONE 5 MG PO TABS: ORAL_TABLET | ORAL | Status: DC

## 2013-04-26 NOTE — Telephone Encounter (Signed)
Are these ok to fill? 

## 2013-04-26 NOTE — Telephone Encounter (Signed)
Decreasing pred taper for PMR

## 2013-06-02 ENCOUNTER — Telehealth: Payer: Self-pay | Admitting: Family Medicine

## 2013-06-02 DIAGNOSIS — F410 Panic disorder [episodic paroxysmal anxiety] without agoraphobia: Secondary | ICD-10-CM

## 2013-06-02 DIAGNOSIS — I10 Essential (primary) hypertension: Secondary | ICD-10-CM

## 2013-06-02 MED ORDER — HYDROCHLOROTHIAZIDE 25 MG PO TABS: 25.0000 mg | ORAL_TABLET | Freq: Every day | ORAL | Status: DC

## 2013-06-02 MED ORDER — PREDNISONE 5 MG PO TABS: ORAL_TABLET | ORAL | Status: DC

## 2013-06-02 MED ORDER — TOPIRAMATE 100 MG PO TABS: ORAL_TABLET | ORAL | Status: DC

## 2013-06-02 MED ORDER — ALPRAZOLAM 0.25 MG PO TABS: 0.2500 mg | ORAL_TABLET | Freq: Two times a day (BID) | ORAL | Status: DC | PRN

## 2013-06-02 NOTE — Telephone Encounter (Signed)
Seth Bake, Will you please let mrs. Horlacher know that her refill requests were fufilled (alprazolam printed and placed in your inbox for faxing). F/U appt needed for future refills.

## 2013-06-02 NOTE — Telephone Encounter (Signed)
Pt notified and voiced understanding 

## 2013-06-07 ENCOUNTER — Encounter: Payer: Self-pay | Admitting: Family Medicine

## 2013-06-07 ENCOUNTER — Ambulatory Visit (INDEPENDENT_AMBULATORY_CARE_PROVIDER_SITE_OTHER): Payer: BC Managed Care – PPO | Admitting: Family Medicine

## 2013-06-07 ENCOUNTER — Ambulatory Visit (INDEPENDENT_AMBULATORY_CARE_PROVIDER_SITE_OTHER): Payer: BC Managed Care – PPO

## 2013-06-07 VITALS — BP 132/84 | HR 79 | Wt 172.0 lb

## 2013-06-07 DIAGNOSIS — F329 Major depressive disorder, single episode, unspecified: Secondary | ICD-10-CM

## 2013-06-07 DIAGNOSIS — F3289 Other specified depressive episodes: Secondary | ICD-10-CM

## 2013-06-07 DIAGNOSIS — I10 Essential (primary) hypertension: Secondary | ICD-10-CM

## 2013-06-07 DIAGNOSIS — R042 Hemoptysis: Secondary | ICD-10-CM

## 2013-06-07 DIAGNOSIS — J449 Chronic obstructive pulmonary disease, unspecified: Secondary | ICD-10-CM

## 2013-06-07 DIAGNOSIS — M353 Polymyalgia rheumatica: Secondary | ICD-10-CM

## 2013-06-07 DIAGNOSIS — F410 Panic disorder [episodic paroxysmal anxiety] without agoraphobia: Secondary | ICD-10-CM

## 2013-06-07 DIAGNOSIS — R918 Other nonspecific abnormal finding of lung field: Secondary | ICD-10-CM

## 2013-06-07 LAB — C-REACTIVE PROTEIN: CRP: 0.5 mg/dL (ref <0.60)

## 2013-06-07 LAB — SEDIMENTATION RATE: Sed Rate: 10 mm/hr (ref 0–22)

## 2013-06-07 MED ORDER — HYDROCHLOROTHIAZIDE 25 MG PO TABS: 25.0000 mg | ORAL_TABLET | Freq: Every day | ORAL | Status: DC

## 2013-06-07 MED ORDER — TOPIRAMATE 100 MG PO TABS: ORAL_TABLET | ORAL | Status: DC

## 2013-06-07 MED ORDER — ALBUTEROL SULFATE HFA 108 (90 BASE) MCG/ACT IN AERS: INHALATION_SPRAY | RESPIRATORY_TRACT | Status: AC

## 2013-06-07 MED ORDER — BUSPIRONE HCL 10 MG PO TABS: 10.0000 mg | ORAL_TABLET | Freq: Two times a day (BID) | ORAL | Status: DC

## 2013-06-07 MED ORDER — BUPROPION HCL ER (XL) 150 MG PO TB24: ORAL_TABLET | ORAL | Status: DC

## 2013-06-07 MED ORDER — PREDNISONE 5 MG PO TABS: ORAL_TABLET | ORAL | Status: DC

## 2013-06-07 NOTE — Progress Notes (Signed)
CC: Rose [name] is a 48 y.o. female is here for depression and anxiety f/u   Subjective: HPI:  Followup depression and anxiety: Since I saw her last we increased Wellbutrin now 300 mg daily despite this increase she has had no improvement with anxiety or depression. Anxiety and panic attacks are described as moderate to severe in severity present most days of the week depression is now nonexistent. In hindsight she states that anxiety is always been more of an influence to her quality of life compared to depression. 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 Symptoms are improved with occasional use of Xanax.  Denies mental disturbance such as paranoia or hallucinations.  Followup hypertension: Continues on hydrochlorothiazide daily without missed doses. She takes her blood pressure at home states pressures are consistently below 140/90. Denies chest pain, irregular heartbeat, no claudication nor motor or sensory disturbances  Followup polymyalgia rheumatica: We've been tapering prednisone since November she noticed that some time after tapering to 7.5 mg daily indefinitely after tapering to 5 mg daily she had significant return of soreness and weakness in her shoulder girdle and proximal leg muscles. Accompanied by this has been an temporal pounding headache.  COPD followup: She is requesting refills on albuterol. She is using this most days of the week it helps dramatically for matter of hours for shortness of breath. She continues to smoke. She has decided to stop taking all inhaled corticosteroids due to fears of seeing black box warnings of inhaled corticosteroids mixed with long-acting beta agonist. She is adamant about not taking any inhalers other than albuterol due to fears of sudden cardiac death.  She casually mentions that for the past one half  weeks has been coughing up bright red blood. She's never had this before. She denies fevers, chills, unintentional weight loss, nosebleeds, nor sore throat.    Review Of Systems Outlined In HPI  Past Medical History  Diagnosis Date  . COPD 05/24/2007    Qualifier: Diagnosis of  By: Esmeralda [name]    . DEPRESSION 05/24/2007    Qualifier: Diagnosis of  By: Esmeralda [name]    . PANIC DISORDER 04/18/2007    Qualifier: Diagnosis of  By: Rose Ramp MD, Rose      No past surgical history on file. Family History  Problem Relation Age of Onset  . Breast cancer Mother   . Prostate cancer Father     History   Social History  . Marital Status: Married    Spouse Name: N/A    Number of Children: N/A  . Years of Education: N/A   Occupational History  . Not on file.   Social History Main Topics  . Smoking status: Current Every Day Smoker -- 0.50 packs/day    Types: Cigarettes  . Smokeless tobacco: Not on file  . Alcohol Use: Not on file  . Drug Use: Not on file  . Sexual Activity: Not on file   Other Topics Concern  . Not on file   Social History Narrative  . No narrative on file     Objective: BP 132/84  Pulse 79  Wt 172 lb (78.019 kg)  General: Alert and Oriented, No Acute Distress HEENT: Pupils equal, round, reactive to light. Conjunctivae clear.  Moist mucous membranes pharynx unremarkable Lungs: Clear to auscultation bilaterally, no wheezing/ronchi/rales.  Comfortable work of breathing. Good air movement. Cardiac: Regular rate and  rhythm. Normal S1/S2.  No murmurs, rubs, nor gallops.   Extremities: No peripheral edema.  Strong peripheral pulses.  Mental Status: No depression, anxiety, nor agitation. Skin: Warm and dry.  Assessment & Plan: [name] was seen today for depression and anxiety f/u.  Diagnoses and associated orders for this visit:  DEPRESSION - buPROPion (WELLBUTRIN XL) 150 MG 24 hr tablet; TAKE 1 TABLET BY MOUTH DAILY IN THE MORNING TO HELP WITH  MOOD  HYPERTENSION, BENIGN ESSENTIAL - hydrochlorothiazide (HYDRODIURIL) 25 MG tablet; Take 1 tablet (25 mg total) by mouth daily.  PANIC DISORDER - busPIRone (BUSPAR) 10 MG tablet; Take 1 tablet (10 mg total) by mouth 2 (two) times daily.  Hemoptysis - CBC w/Diff - DG Chest 2 View; Future  COPD - albuterol (PROVENTIL HFA;VENTOLIN HFA) 108 (90 BASE) MCG/ACT inhaler; Inhale two puffs every 4-6 hours only as needed for shortness of breath or wheezing.  Polymyalgia rheumatica - Sed Rate (ESR) - C-reactive protein  Other Orders - topiramate (TOPAMAX) 100 MG tablet; TAKE 1 TABLET BY MOUTH TWICE DAILY - predniSONE (DELTASONE) 5 MG tablet; Continue taper with 1.5 tablet daily.    Depression: Controlled tapering down on Wellbutrin over the next 5 days Panic disorder/anxiety: Uncontrolled continue Xanax adding BuSpar after stopping Wellbutrin Essential hypertension: Controlled continue hydrochlorothiazide COPD: Uncontrolled have encouraged her to consider inhaled corticosteroids however she is adamant never again using these even though black box warnings her only apply to preparations including long-acting beta agonists Polymyalgia rheumatica: Controlled recheck an ESR and CRP increasing prednisone back to 7.5 mg with a slower taper from now on Hemostasis: Checking CBC and chest x-ray   Return in about 3 months (around 09/07/2013).

## 2013-06-08 ENCOUNTER — Telehealth: Payer: Self-pay | Admitting: Family Medicine

## 2013-06-08 DIAGNOSIS — R042 Hemoptysis: Secondary | ICD-10-CM

## 2013-06-08 LAB — CBC WITH DIFFERENTIAL/PLATELET
Basophils Absolute: 0.1 10*3/uL (ref 0.0–0.1)
Basophils Relative: 1 % (ref 0–1)
Eosinophils Absolute: 0 10*3/uL (ref 0.0–0.7)
Eosinophils Relative: 0 % (ref 0–5)
HCT: 42.4 % (ref 36.0–46.0)
HEMOGLOBIN: 14.6 g/dL (ref 12.0–15.0)
LYMPHS ABS: 1.2 10*3/uL (ref 0.7–4.0)
LYMPHS PCT: 16 % (ref 12–46)
MCH: 32.2 pg (ref 26.0–34.0)
MCHC: 34.4 g/dL (ref 30.0–36.0)
MCV: 93.4 fL (ref 78.0–100.0)
MONOS PCT: 3 % (ref 3–12)
Monocytes Absolute: 0.2 10*3/uL (ref 0.1–1.0)
NEUTROS ABS: 6.1 10*3/uL (ref 1.7–7.7)
NEUTROS PCT: 80 % — AB (ref 43–77)
Platelets: 258 10*3/uL (ref 150–400)
RBC: 4.54 MIL/uL (ref 3.87–5.11)
RDW: 14.1 % (ref 11.5–15.5)
WBC: 7.6 10*3/uL (ref 4.0–10.5)

## 2013-06-08 NOTE — Telephone Encounter (Signed)
Seth Bake, Will you please let Rose [name] know that her white blood cell count and hemoglobin were normal. Erythrocyte Sedementation Rate and CRP were both normal.  This is reassuring for her polymyalgia rheumatica but we'll keep her at 7.5mg  of prednisone for another month at least due to her symptoms. With respect to the blood she's coughing up her CXR was normal however standard workup involves referral to a pulmonologist which I've placed today.  Please contact me if she's not heard about scheduling by next week.

## 2013-06-08 NOTE — Telephone Encounter (Signed)
Patient advised.

## 2013-06-09 ENCOUNTER — Ambulatory Visit (INDEPENDENT_AMBULATORY_CARE_PROVIDER_SITE_OTHER): Payer: BC Managed Care – PPO | Admitting: Internal Medicine

## 2013-06-09 ENCOUNTER — Encounter: Payer: Self-pay | Admitting: Internal Medicine

## 2013-06-09 VITALS — BP 126/74 | HR 75 | Temp 98.3°F | Ht 65.0 in | Wt 172.6 lb

## 2013-06-09 DIAGNOSIS — R059 Cough, unspecified: Secondary | ICD-10-CM

## 2013-06-09 DIAGNOSIS — R042 Hemoptysis: Secondary | ICD-10-CM

## 2013-06-09 DIAGNOSIS — F17200 Nicotine dependence, unspecified, uncomplicated: Secondary | ICD-10-CM

## 2013-06-09 DIAGNOSIS — R05 Cough: Secondary | ICD-10-CM

## 2013-06-09 MED ORDER — FAMOTIDINE 20 MG PO TABS: ORAL_TABLET | ORAL | Status: DC

## 2013-06-09 MED ORDER — PANTOPRAZOLE SODIUM 40 MG PO TBEC: 40.0000 mg | DELAYED_RELEASE_TABLET | Freq: Every day | ORAL | Status: DC

## 2013-06-09 MED ORDER — TRAMADOL HCL 50 MG PO TABS: ORAL_TABLET | ORAL | Status: DC

## 2013-06-09 MED ORDER — FLUTTER DEVI: Status: DC

## 2013-06-09 NOTE — Progress Notes (Signed)
   Subjective:    Patient ID: Rose [name], female    DOB: [birth year]  MRN: [id number]  HPI  48 yowf grew up with a father who smoked with tendency to bronchitis / pleurisy sev times a year and then started smoking age 48 with fall to winter, better in summer referred to pulmonary 06/09/2013 by Dr Ileene Rubens for new hemoptysis.  06/09/2013 [facility]st [facility] office visit/ [name] cc cough x decades, worse in cold weather variably productive of yellowish mucus but turned sltly bloody x 2 months every 2-3 days with violent coughs assoc with nose bleeding. Sob x min activity>  Some better with neb saba over hfa - already on pred for PMR  No obvious other patterns in day to day or daytime variabilty or assoc  cp or chest tightness, subjective wheeze overt sinus or hb symptoms. No unusual exp hx or h/o childhood pna/ asthma or knowledge of premature birth.  Sleeping ok without nocturnal  or early am exacerbation  of respiratory  c/o's or need for noct saba. Also denies any obvious fluctuation of symptoms with weather or environmental changes or other aggravating or alleviating factors except as outlined above   Current Medications, Allergies, Complete Past Medical History, Past Surgical History, Family History, and Social History were reviewed in [employer] record.             Review of Systems  Constitutional: Negative for fever, chills and unexpected weight change.  HENT: Positive for congestion. Negative for dental problem, ear pain, nosebleeds, postnasal drip, rhinorrhea, sinus pressure, sneezing, sore throat, trouble swallowing and voice change.   Eyes: Negative for visual disturbance.  Respiratory: Positive for cough and shortness of breath. Negative for choking.        Hemoptysis  Cardiovascular: Negative for chest pain and leg swelling.  Gastrointestinal: Negative for vomiting, abdominal pain and diarrhea.  Genitourinary: Negative for difficulty urinating.   Musculoskeletal: Negative for arthralgias.  Skin: Negative for rash.  Neurological: Positive for headaches. Negative for tremors and syncope.  Hematological: Does not bruise/bleed easily.       Objective:   Physical Exam   Anxious amb wf nad chewing mint gum with very harsh barking cough   Wt Readings from Last 3 Encounters:  06/09/13 172 lb 9.6 oz (78.291 kg)  06/07/13 172 lb (78.019 kg)  12/07/12 171 lb (77.565 kg)      HEENT: nl dentition, turbinates, and orophanx. Nl external ear canals without cough reflex   NECK :  without JVD/Nodes/TM/ nl carotid upstrokes bilaterally   LUNGS: no acc muscle use, clear to A and P bilaterally without cough on insp or exp maneuvers   CV:  RRR  no s3 or murmur or increase in P2, no edema   ABD:  soft and nontender with nl excursion in the supine position. No bruits or organomegaly, bowel sounds nl  MS:  warm without deformities, calf tenderness, cyanosis or clubbing  SKIN: warm and dry without lesions    NEURO:  alert, approp, no deficits    cxr 06/07/2013  No pneumonia. Peribronchial thickening may indicate bronchitis         Assessment & Plan:

## 2013-06-09 NOTE — Patient Instructions (Addendum)
The key is to stop smoking completely before smoking completely stops you!   Flutter valve as much as possible.  The key to effective treatment for your cough is eliminating the non-stop cycle of cough you're stuck in long enough to let your airway heal completely and then see if there is anything still making you cough once you stop the cough suppression, but this should take no more than 5 days to figure out  First take delsym two tsp every 12 hours and supplement if needed with  tramadol 50 mg up to 2 every 4 hours to suppress the urge to cough at all or even clear your throat. Swallowing water or using ice chips/non mint and menthol containing candies (such as lifesavers or sugarless jolly ranchers) are also effective.  You should rest your voice and avoid activities that you know make you cough.  Once you have eliminated the cough for 3 straight days try reducing the tramadol first,  then the delsym as tolerated.    Try prilosec 20mg   Take 30-60 min before first meal of the day and Pepcid 20 mg one bedtime until cough is completely gone for at least a week without the need for cough suppression  I think of reflux for chronic cough like I do oxygen for fire (doesn't cause the fire but once you get the oxygen suppressed it usually goes away regardless of the exact cause).  GERD (REFLUX)  is an extremely common cause of respiratory symptoms, many times with no significant heartburn at all.    It can be treated with medication, but also with lifestyle changes including avoidance of late meals, excessive alcohol, smoking cessation, and avoid fatty foods, chocolate, peppermint, colas, red wine, and acidic juices such as orange juice.  NO MINT OR MENTHOL PRODUCTS SO NO COUGH DROPS  USE SUGARLESS CANDY INSTEAD (jolley ranchers or Stover's)  NO OIL BASED VITAMINS - use powdered substitutes.       Please schedule a follow up office visit in 2 weeks, sooner if needed

## 2013-06-09 NOTE — Progress Notes (Signed)
   Subjective:    Patient ID: Rose [name], female    DOB: 10-26-1965, 49 y.o.   MRN: [id number]  HPI    Review of Systems     Objective:   Physical Exam        Assessment & Plan:

## 2013-06-10 DIAGNOSIS — R042 Hemoptysis: Secondary | ICD-10-CM | POA: Insufficient documentation

## 2013-06-10 DIAGNOSIS — R05 Cough: Secondary | ICD-10-CM | POA: Insufficient documentation

## 2013-06-10 DIAGNOSIS — R059 Cough, unspecified: Secondary | ICD-10-CM | POA: Insufficient documentation

## 2013-06-10 HISTORY — DX: Cough, unspecified: R05.9

## 2013-06-10 HISTORY — DX: Hemoptysis: R04.2

## 2013-06-10 NOTE — Assessment & Plan Note (Signed)
The most common causes of chronic cough in immunocompetent adults include the following: upper airway cough syndrome (UACS), previously referred to as postnasal drip syndrome (PNDS), which is caused by variety of rhinosinus conditions; (2) asthma; (3) GERD; (4) chronic bronchitis from cigarette smoking or other inhaled environmental irritants; (5) nonasthmatic eosinophilic bronchitis; and (6) bronchiectasis.   These conditions, singly or in combination, have accounted for up to 94% of the causes of chronic cough in prospective studies.   Other conditions have constituted no >6% of the causes in prospective studies These have included bronchogenic carcinoma, chronic interstitial pneumonia, sarcoidosis, left ventricular failure, ACEI-induced cough, and aspiration from a condition associated with pharyngeal dysfunction.    Chronic cough is often simultaneously caused by more than one condition. A single cause has been found from 38 to 82% of the time, multiple causes from 18 to 62%. Multiply caused cough has been the result of three diseases up to 42% of the time.       Of the three most common causes of chronic cough, only one (GERD)  can actually cause the other two (asthma and post nasal drip syndrome)  and perpetuate the cylce of cough inducing airway trauma, inflammation, heightened sensitivity to reflux which is prompted by the cough itself via a cyclical mechanism.    This may partially respond to steroids and look like asthma and post nasal drainage but never erradicated completely unless the cough and the secondary reflux are eliminated, preferably both at the same time.  While not intuitively obvious, many patients with chronic low grade reflux do not cough until there is a secondary insult that disturbs the protective epithelial barrier and exposes sensitive nerve endings.  This can be viral or direct physical injury such as with an endotracheal tube.   The point is that once this occurs, it is  difficult to eliminate using anything but a maximally effective acid suppression regimen at least in the short run, accompanied by an appropriate diet to address non acid GERD.   For now will try to eliminate cyclical coughing then regroup in 2 weeks

## 2013-06-10 NOTE — Assessment & Plan Note (Signed)
Chronicity bothersome for underlying lung ca but very low yield fob in pt with nl cxr and fob likely to aggravate severe cough so will postpone for now.

## 2013-06-10 NOTE — Assessment & Plan Note (Signed)

## 2013-06-23 ENCOUNTER — Encounter: Payer: Self-pay | Admitting: Internal Medicine

## 2013-06-23 ENCOUNTER — Ambulatory Visit (INDEPENDENT_AMBULATORY_CARE_PROVIDER_SITE_OTHER): Payer: BC Managed Care – PPO | Admitting: Internal Medicine

## 2013-06-23 VITALS — BP 128/70 | HR 70 | Temp 98.0°F | Ht 65.0 in | Wt 169.9 lb

## 2013-06-23 DIAGNOSIS — J449 Chronic obstructive pulmonary disease, unspecified: Secondary | ICD-10-CM

## 2013-06-23 DIAGNOSIS — Z23 Encounter for immunization: Secondary | ICD-10-CM

## 2013-06-23 DIAGNOSIS — R05 Cough: Secondary | ICD-10-CM

## 2013-06-23 DIAGNOSIS — R059 Cough, unspecified: Secondary | ICD-10-CM

## 2013-06-23 NOTE — Assessment & Plan Note (Signed)
-   spirometry 06/23/2013 FEV1  2.07 ( 74%) ratio 69 so GOLD II barely but still smoking   I reviewed the Flethcher curve with patient that basically indicates  if you quit smoking when your best day FEV1 is still well preserved it is highly unlikely you will progress to severe disease and informed the patient there was no medication on the market that has proven to change the curve or the likelihood of progression.  Therefore stopping smoking and maintaining abstinence is the most important aspect of care, not choice of inhalers or for that matter, doctors.    So pulmonary f/u can be prn

## 2013-06-23 NOTE — Patient Instructions (Addendum)
You have very mild copd that will get worse if you don't stop smoking  Don't stop the acid for 6 more weeks then ok to try off - if cough starts back then start back the medication right away and Dr Ileene Rubens  Pulmonary follow up is as needed

## 2013-06-23 NOTE — Assessment & Plan Note (Signed)
Cough better to her satisfaction with no more hemoptysis on gerd rx but still smoking   If hemoptysis recurs in the absence of an obvious infection or episode of epistaxis, reasonsable to do CT chest and even if this is negative consider a quick look FOB to see if can identify soursce.

## 2013-06-23 NOTE — Progress Notes (Signed)
Subjective:    Patient ID: Rose [name], female    DOB: 02-Oct-1965  MRN: [id number]   Brief patient profile:  48 yowf grew up with a father who smoked with tendency to bronchitis / pleurisy sev times a year and then started smoking age 48 with fall to winter, better in summer referred to pulmonary 06/09/2013 by Dr Ileene Rubens for new hemoptysis.   History of Present Illness  06/09/2013 1st [facility] office visit/ Rose [name] cc cough x decades, worse in cold weather variably productive of yellowish mucus but turned sltly bloody x 2 months every 2-3 days with violent coughs assoc with nose bleeding. Sob x min activity>  Some better with neb saba over hfa - already on pred for PMR rec The key is to stop smoking completely before smoking completely stops you!  Flutter valve as much as possible. First take delsym two tsp every 12 hours and supplement if needed with  tramadol 50 mg up to 2 every 4 hours to suppress the urge to cough at all or even clear your throat.  Once you have eliminated the cough for 3 straight days try reducing the tramadol first,  then the delsym as tolerated.   Try prilosec 20mg   Take 30-60 min before first meal of the day and Pepcid 20 mg one bedtime  GERD diet    06/23/2013 f/u ov/Rose [name] re: copd/ cougher/ pred 5 mg daily / never took the tramadol Chief Complaint  Patient presents with  . Follow-up    Pt reports her breathing and cough are much improved. No new co's today.   cough better to her satisfaction Doe x > HT Using alb maybe once a week and really doesn't help breathing   No obvious day to day or daytime variabilty or assoc   cp or chest tightness, subjective wheeze overt sinus or hb symptoms. No unusual exp hx or h/o childhood pna/ asthma or knowledge of premature birth.  Sleeping ok without nocturnal  or early am exacerbation  of respiratory  c/o's or need for noct saba. Also denies any obvious fluctuation of symptoms with weather or environmental changes or  other aggravating or alleviating factors except as outlined above   Current Medications, Allergies, Complete Past Medical History, Past Surgical History, Family History, and Social History were reviewed in [employer] record.  ROS  The following are not active complaints unless bolded sore throat, dysphagia, dental problems, itching, sneezing,  nasal congestion or excess/ purulent secretions, ear ache,   fever, chills, sweats, unintended wt loss, pleuritic or exertional cp, hemoptysis,  orthopnea pnd or leg swelling, presyncope, palpitations, heartburn, abdominal pain, anorexia, nausea, vomiting, diarrhea  or change in bowel or urinary habits, change in stools or urine, dysuria,hematuria,  rash, arthralgias, visual complaints, headache, numbness weakness or ataxia or problems with walking or coordination,  change in mood/affect or memory.                      Objective:   Physical Exam   Anxious amb wf   06/23/2013       170  Wt Readings from Last 3 Encounters:  06/09/13 172 lb 9.6 oz (78.291 kg)  06/07/13 172 lb (78.019 kg)  12/07/12 171 lb (77.565 kg)      HEENT: nl dentition, turbinates, and orophanx. Nl external ear canals without cough reflex   NECK :  without JVD/Nodes/TM/ nl carotid upstrokes bilaterally   LUNGS: no acc muscle use, clear to  A and P bilaterally without cough on insp or exp maneuvers   CV:  RRR  no s3 or murmur or increase in P2, no edema   ABD:  soft and nontender with nl excursion in the supine position. No bruits or organomegaly, bowel sounds nl  MS:  warm without deformities, calf tenderness, cyanosis or clubbing  SKIN: warm and dry without lesions       cxr 06/07/2013  No pneumonia. Peribronchial thickening may indicate bronchitis         Assessment & Plan:

## 2013-07-03 ENCOUNTER — Other Ambulatory Visit: Payer: Self-pay | Admitting: Family Medicine

## 2013-09-06 ENCOUNTER — Ambulatory Visit (INDEPENDENT_AMBULATORY_CARE_PROVIDER_SITE_OTHER): Payer: BC Managed Care – PPO | Admitting: Family Medicine

## 2013-09-06 ENCOUNTER — Encounter: Payer: Self-pay | Admitting: Family Medicine

## 2013-09-06 VITALS — BP 115/79 | HR 73 | Temp 98.1°F | Wt 164.0 lb

## 2013-09-06 DIAGNOSIS — K5900 Constipation, unspecified: Secondary | ICD-10-CM

## 2013-09-06 DIAGNOSIS — J449 Chronic obstructive pulmonary disease, unspecified: Secondary | ICD-10-CM

## 2013-09-06 DIAGNOSIS — J441 Chronic obstructive pulmonary disease with (acute) exacerbation: Secondary | ICD-10-CM

## 2013-09-06 DIAGNOSIS — K5904 Chronic idiopathic constipation: Secondary | ICD-10-CM

## 2013-09-06 MED ORDER — LEVOFLOXACIN 500 MG PO TABS: 500.0000 mg | ORAL_TABLET | Freq: Every day | ORAL | Status: DC

## 2013-09-06 MED ORDER — PREDNISONE 20 MG PO TABS: ORAL_TABLET | ORAL | Status: AC

## 2013-09-06 MED ORDER — LINACLOTIDE 145 MCG PO CAPS: 145.0000 ug | ORAL_CAPSULE | Freq: Every day | ORAL | Status: DC

## 2013-09-06 MED ORDER — TOPIRAMATE 100 MG PO TABS: ORAL_TABLET | ORAL | Status: DC

## 2013-09-06 NOTE — Progress Notes (Signed)
CC: Rose [name] is a 48 y.o. female is here for Cough   Subjective: HPI:  Complains of productive cough for the last week it is worsening on a daily basis. Accompanied by wheezing and mild shortness of breath. Overall symptoms are moderate in severity present all hours of the day. Slightly improved with albuterol, nothing particularly makes it worse.  By chills but denies fevers, blood and sputum, chest pain, confusion or motor or sensory disturbances  She is requesting a refill on linzess which has completely resolved her chronic constipation. She has producible pain with bowel movements on a daily basis now without abdominal pain   Review Of Systems Outlined In HPI  Past Medical History  Diagnosis Date  . COPD 05/24/2007    Qualifier: Diagnosis of  By: [name]    . DEPRESSION 05/24/2007    Qualifier: Diagnosis of  By: [name]    . PANIC DISORDER 04/18/2007    Qualifier: Diagnosis of  By: Ronnald Ramp MD, Lauren    . OSA (obstructive sleep apnea)     on CPAP     No past surgical history on file. Family History  Problem Relation Age of Onset  . Breast cancer Mother   . Prostate cancer Father   . Heart disease Father   . Heart disease Brother   . Heart disease Sister     History   Social History  . Marital Status: Married    Spouse Name: N/A    Number of Children: N/A  . Years of Education: N/A   Occupational History  . Construction    Social History Main Topics  . Smoking status: Current Every Day Smoker -- 0.50 packs/day for 37 years    Types: Cigarettes  . Smokeless tobacco: Never Used     Comment: using vape   . Alcohol Use: No  . Drug Use: No  . Sexual Activity: Not on file   Other Topics Concern  . Not on file   Social History Narrative  . No narrative on file     Objective: BP 115/79  Pulse 73  Temp(Src) 98.1 F (36.7 C)  Wt 164 lb (74.39 kg)  SpO2 96%  General: Alert and Oriented, No Acute Distress HEENT: Pupils equal, round,  reactive to light. Conjunctivae clear.  External ears unremarkable, canals clear with intact TMs with appropriate landmarks.  Middle ear appears open without effusion. Pink inferior turbinates.  Moist mucous membranes, pharynx without inflammation nor lesions.  Neck supple without palpable lymphadenopathy nor abnormal masses. Lungs: Trace end expiratory wheezing with distant breath sounds. No rhonchi nor rales. Comfortable work of breathing Cardiac: Regular rate and rhythm. Normal S1/S2.  No murmurs, rubs, nor gallops.   Mental Status: No depression, anxiety, nor agitation. Skin: Warm and dry.  Assessment & Plan: Rose [name] was seen today for cough.  Diagnoses and associated orders for this visit:  COPD GOLD II  COPD exacerbation - levofloxacin (LEVAQUIN) 500 MG tablet; Take 1 tablet (500 mg total) by mouth daily. - predniSONE (DELTASONE) 20 MG tablet; Three tabs at once daily for five days.  Chronic idiopathic constipation - Linaclotide (LINZESS) 145 MCG CAPS capsule; Take 1 capsule (145 mcg total) by mouth daily.  Other Orders - topiramate (TOPAMAX) 100 MG tablet; TAKE 1 TABLET BY MOUTH TWICE DAILY    COPD exacerbation: Continue as needed albuterol (with levofloxacin. Call if no better by Friday Chronic idiopathic constipation: Controlled continue linzess   Return if symptoms worsen or fail  to improve.

## 2013-09-08 ENCOUNTER — Encounter: Payer: Self-pay | Admitting: Family Medicine

## 2013-09-12 ENCOUNTER — Other Ambulatory Visit: Payer: Self-pay | Admitting: Family Medicine

## 2013-09-28 ENCOUNTER — Other Ambulatory Visit: Payer: Self-pay | Admitting: Family Medicine

## 2013-11-27 ENCOUNTER — Ambulatory Visit (INDEPENDENT_AMBULATORY_CARE_PROVIDER_SITE_OTHER): Payer: BC Managed Care – PPO | Admitting: Family Medicine

## 2013-11-27 ENCOUNTER — Encounter: Payer: Self-pay | Admitting: Family Medicine

## 2013-11-27 VITALS — BP 115/74 | HR 72 | Temp 98.0°F | Wt 164.0 lb

## 2013-11-27 DIAGNOSIS — G478 Other sleep disorders: Secondary | ICD-10-CM

## 2013-11-27 DIAGNOSIS — J329 Chronic sinusitis, unspecified: Secondary | ICD-10-CM

## 2013-11-27 DIAGNOSIS — A499 Bacterial infection, unspecified: Secondary | ICD-10-CM

## 2013-11-27 DIAGNOSIS — M353 Polymyalgia rheumatica: Secondary | ICD-10-CM

## 2013-11-27 DIAGNOSIS — B9689 Other specified bacterial agents as the cause of diseases classified elsewhere: Secondary | ICD-10-CM

## 2013-11-27 MED ORDER — PREDNISONE 5 MG PO TABS
ORAL_TABLET | ORAL | Status: DC
Start: 2013-11-27 — End: 2013-11-27

## 2013-11-27 MED ORDER — AMOXICILLIN-POT CLAVULANATE 500-125 MG PO TABS: ORAL_TABLET | ORAL | Status: AC

## 2013-11-27 MED ORDER — TOPIRAMATE 100 MG PO TABS: ORAL_TABLET | ORAL | Status: DC

## 2013-11-27 MED ORDER — ALPRAZOLAM 0.25 MG PO TABS: ORAL_TABLET | ORAL | Status: DC

## 2013-11-27 NOTE — Progress Notes (Signed)
CC: Rose [name] is a 48 y.o. female is here for Sinusitis   Subjective: HPI:  Complaint of nasal congestion and bilateral facial pain localized beneath the eyes has been present for the past week worsening on a daily basis. Now moderate in severity present all hours of the day. No improvement with antihistamines, no other interventions.  Accompanied by subjective postnasal drip. Denies fevers, chills, cough, shortness of breath, nor motor or sensory disturbances  She complains that she is experiencing extreme fatigue over the past 2-3 months. She reports nonrestorative sleep, falling asleep behind the wheel, fatigue throughout the day and feeling like she constantly needs to take a nap. This is all despite her continued use of her CPAP machine on a nightly basis. Denies depression and states that anxiety is under control with as needed Xanax. Denies any shortness of breath, chest pain, nor weakness  Follow polymyalgia rheumatica: Denies any proximal extremity weakness or soreness for over a month now continues to take prednisone 7.5 mg daily    Review Of Systems Outlined In HPI  Past Medical History  Diagnosis Date  . COPD 05/24/2007    Qualifier: Diagnosis of  By: [name]    . DEPRESSION 05/24/2007    Qualifier: Diagnosis of  By: [name]    . PANIC DISORDER 04/18/2007    Qualifier: Diagnosis of  By: Ronnald Ramp MD, Lauren    . OSA (obstructive sleep apnea)     on CPAP     No past surgical history on file. Family History  Problem Relation Age of Onset  . Breast cancer Mother   . Prostate cancer Father   . Heart disease Father   . Heart disease Brother   . Heart disease Sister     History   Social History  . Marital Status: Married    Spouse Name: N/A    Number of Children: N/A  . Years of Education: N/A   Occupational History  . Construction    Social History Main Topics  . Smoking status: Current Every Day Smoker -- 0.50 packs/day for 37 years    Types:  Cigarettes  . Smokeless tobacco: Never Used     Comment: using vape   . Alcohol Use: No  . Drug Use: No  . Sexual Activity: Not on file   Other Topics Concern  . Not on file   Social History Narrative  . No narrative on file     Objective: BP 115/74  Pulse 72  Temp(Src) 98 F (36.7 C) (Oral)  Wt 164 lb (74.39 kg)  General: Alert and Oriented, No Acute Distress HEENT: Pupils equal, round, reactive to light. Conjunctivae clear.  External ears unremarkable, canals clear with intact TMs with appropriate landmarks.  Middle ear appears open without effusion. Pink inferior turbinates.  Moist mucous membranes, pharynx without inflammation nor lesions however moderate cobblestoning.  Neck supple without palpable lymphadenopathy nor abnormal masses. Lungs: Clear to auscultation bilaterally, no wheezing/ronchi/rales.  Comfortable work of breathing. Good air movement. Cardiac: Regular rate and rhythm. Normal S1/S2.  No murmurs, rubs, nor gallops.   Extremities: No peripheral edema.  Strong peripheral pulses.  Mental Status: No depression, anxiety, nor agitation. Skin: Warm and dry.  Assessment & Plan: Rose [name] was seen today for sinusitis.  Diagnoses and associated orders for this visit:  Non-restorative sleep - Cpap titration; Future  Bacterial sinusitis - amoxicillin-clavulanate (AUGMENTIN) 500-125 MG per tablet; Take one by mouth every 8 hours for ten total days.  Polymyalgia rheumatica  Other Orders - Discontinue: predniSONE (DELTASONE) 5 MG tablet; Continue taper with 1.5 tablet daily. - ALPRAZolam (XANAX) 0.25 MG tablet; TAKE 1 TABLET BY MOUTH TWICE DAILY AS NEEDED FOR SLEEP - topiramate (TOPAMAX) 100 MG tablet; TAKE 1 TABLET BY MOUTH TWICE DAILY    Nonrestorative sleep: Suspect this is due to uncontrolled sleep apnea setting her up for CPAP titration I've asked her to call me if she's not notified of scheduling this within the next one to 2 weeks Polymyalgia rheumatica:  Improving decreasing prednisone now only 5 mg daily Actual sinusitis: Start Augmentin Anxiety: Controlled on as needed Xanax use Migraines: Controlled Continue Topamax Return in about 3 months (around 02/27/2014).

## 2013-12-25 ENCOUNTER — Encounter: Payer: Self-pay | Admitting: Family Medicine

## 2013-12-26 ENCOUNTER — Encounter: Payer: Self-pay | Admitting: Family Medicine

## 2013-12-26 ENCOUNTER — Ambulatory Visit (INDEPENDENT_AMBULATORY_CARE_PROVIDER_SITE_OTHER): Payer: BC Managed Care – PPO | Admitting: Family Medicine

## 2013-12-26 VITALS — BP 118/75 | HR 67 | Temp 98.3°F | Wt 162.0 lb

## 2013-12-26 DIAGNOSIS — J329 Chronic sinusitis, unspecified: Secondary | ICD-10-CM | POA: Diagnosis not present

## 2013-12-26 DIAGNOSIS — A499 Bacterial infection, unspecified: Secondary | ICD-10-CM

## 2013-12-26 DIAGNOSIS — B9689 Other specified bacterial agents as the cause of diseases classified elsewhere: Secondary | ICD-10-CM | POA: Diagnosis not present

## 2013-12-26 MED ORDER — PREDNISONE 20 MG PO TABS: ORAL_TABLET | ORAL | Status: AC

## 2013-12-26 MED ORDER — LEVOFLOXACIN 500 MG PO TABS: 500.0000 mg | ORAL_TABLET | Freq: Every day | ORAL | Status: DC

## 2013-12-26 NOTE — Progress Notes (Signed)
CC: Rose [name] is a 48 y.o. female is here for Sinusitis   Subjective: HPI:  Complains of thick nasal discharge, postnasal drip, nonproductive cough and pressure in the ears and pinnae both eyes that has been present for the past 2-3 weeks. Worsening over the past week. Symptoms are worse in the evening but moderate to severe at home at work and any hour of the day per her report. No benefit from antihistamines, pseudoephedrine, phenylephrine, or DayQuil. No other interventions as of yet. She feels she never got 100% better since her last sinus infection. Accompanied by subjective fevers and chills over the past few days. Denies shortness of breath, wheezing, chest pain, abdominal pain, confusion, motor or sensory disturbances   Review Of Systems Outlined In HPI  Past Medical History  Diagnosis Date  . COPD 05/24/2007    Qualifier: Diagnosis of  By: [name]    . DEPRESSION 05/24/2007    Qualifier: Diagnosis of  By: [name]    . PANIC DISORDER 04/18/2007    Qualifier: Diagnosis of  By: Ronnald Ramp MD, Lauren    . OSA (obstructive sleep apnea)     on CPAP     No past surgical history on file. Family History  Problem Relation Age of Onset  . Breast cancer Mother   . Prostate cancer Father   . Heart disease Father   . Heart disease Brother   . Heart disease Sister     History   Social History  . Marital Status: Married    Spouse Name: N/A    Number of Children: N/A  . Years of Education: N/A   Occupational History  . Construction    Social History Main Topics  . Smoking status: Current Every Day Smoker -- 0.50 packs/day for 37 years    Types: Cigarettes  . Smokeless tobacco: Never Used     Comment: using vape   . Alcohol Use: No  . Drug Use: No  . Sexual Activity: Not on file   Other Topics Concern  . Not on file   Social History Narrative  . No narrative on file     Objective: BP 118/75  Pulse 67  Temp(Src) 98.3 F (36.8 C) (Oral)  Wt 162 lb  (73.483 kg)  General: Alert and Oriented, No Acute Distress HEENT: Pupils equal, round, reactive to light. Conjunctivae clear.  External ears unremarkable, canals clear with intact TMs with appropriate landmarks.  Middle ear appears open without effusion. Boggy erythematous inferior turbinates.  Moist mucous membranes, pharynx without inflammation nor lesions however moderate cobblestoning.  Neck supple without palpable lymphadenopathy nor abnormal masses. Lungs: Clear to auscultation bilaterally, no wheezing/ronchi/rales.  Comfortable work of breathing. Good air movement. Frequent coughing Extremities: No peripheral edema.  Strong peripheral pulses.  Mental Status: No depression, anxiety, nor agitation. Skin: Warm and dry.  Assessment & Plan: [name] was seen today for sinusitis.  Diagnoses and associated orders for this visit:  Bacterial sinusitis - levofloxacin (LEVAQUIN) 500 MG tablet; Take 1 tablet (500 mg total) by mouth daily. - predniSONE (DELTASONE) 20 MG tablet; Three tabs daily days 1-3, two tabs daily days 4-6, one tab daily days 7-9, half tab daily days 10-13.    Bacterial sinusitis: Start levofloxacin and prednisone taper consider nasal saline washes and Advil cold and sinus   Return if symptoms worsen or fail to improve.

## 2014-01-19 ENCOUNTER — Other Ambulatory Visit: Payer: Self-pay

## 2014-02-21 ENCOUNTER — Ambulatory Visit (HOSPITAL_BASED_OUTPATIENT_CLINIC_OR_DEPARTMENT_OTHER): Payer: BC Managed Care – PPO | Attending: Family Medicine | Admitting: Radiology

## 2014-02-21 VITALS — Ht 65.0 in | Wt 163.0 lb

## 2014-02-21 DIAGNOSIS — G478 Other sleep disorders: Secondary | ICD-10-CM

## 2014-02-21 DIAGNOSIS — Z6827 Body mass index (BMI) 27.0-27.9, adult: Secondary | ICD-10-CM | POA: Diagnosis not present

## 2014-02-21 DIAGNOSIS — G4733 Obstructive sleep apnea (adult) (pediatric): Secondary | ICD-10-CM

## 2014-02-21 DIAGNOSIS — G4710 Hypersomnia, unspecified: Secondary | ICD-10-CM | POA: Diagnosis present

## 2014-02-21 DIAGNOSIS — G4730 Sleep apnea, unspecified: Secondary | ICD-10-CM | POA: Diagnosis present

## 2014-02-24 DIAGNOSIS — G478 Other sleep disorders: Secondary | ICD-10-CM

## 2014-02-24 DIAGNOSIS — G4733 Obstructive sleep apnea (adult) (pediatric): Secondary | ICD-10-CM

## 2014-02-24 NOTE — Sleep Study (Signed)
   NAME: Rose [name] DATE OF BIRTH:  1965-05-27 MEDICAL RECORD NUMBER [id number]  LOCATION: [facility]  PHYSICIAN: Cabella Kimm D  DATE OF STUDY: 02/21/2014  SLEEP STUDY TYPE: Nocturnal Polysomnogram-CPAP titration               REFERRING PHYSICIAN: Marcial Pacas, DO  INDICATION FOR STUDY: Hypersomnia with sleep apnea  EPWORTH SLEEPINESS SCORE:   11/24 HEIGHT: 5\' 5"  (165.1 cm)  WEIGHT: 163 lb (73.936 kg)    Body mass index is 27.12 kg/(m^2).  NECK SIZE: 14 in.  MEDICATIONS: Charted for review  SLEEP ARCHITECTURE: Total sleep time 361.5 minutes with sleep efficiency 93.2%. Stage I was 4.3%, stage II 67.6%, stage III 3.7%, REM 24.3% of total sleep time. Sleep latency 10 minutes, REM latency 60.5 minutes, awake after sleep onset 16.5 minutes, arousal index 14.9, bedtime medication: None  RESPIRATORY DATA: CPAP titration protocol. CPAP was titrated to 13 CWP, AHI 0 per hour. She wore an extra small fullface mask.  OXYGEN DATA: At final CPAP, snoring was prevented and mean oxygen saturation was 95.7% on room air.  CARDIAC DATA: Sinus rhythm with PVCs  MOVEMENT/PARASOMNIA: No significant movement disturbance, no bathroom trips  IMPRESSION/ RECOMMENDATION:   1) Successful CPAP titration to 13 CWP, AHI 0 per hour. She wore an extra small ResMed AirFit F-10 fullface mask with heated humidifier. Snoring was prevented and mean oxygen saturation was 85.7% on room air. 2) Baseline polysomnogram is available from [facility] dated 02/11/2010 with AHI 12.7 per hour.   Deneise Lever Diplomate, American Board of Sleep Medicine  ELECTRONICALLY SIGNED ON:  02/24/2014, 10:55 AM [place] PH: [phone]   FX: [phone] [place]

## 2014-02-26 ENCOUNTER — Telehealth: Payer: Self-pay | Admitting: Family Medicine

## 2014-02-26 DIAGNOSIS — J449 Chronic obstructive pulmonary disease, unspecified: Secondary | ICD-10-CM

## 2014-02-26 DIAGNOSIS — G4730 Sleep apnea, unspecified: Secondary | ICD-10-CM

## 2014-02-26 DIAGNOSIS — Z9989 Dependence on other enabling machines and devices: Secondary | ICD-10-CM

## 2014-02-26 DIAGNOSIS — G4733 Obstructive sleep apnea (adult) (pediatric): Secondary | ICD-10-CM

## 2014-02-26 DIAGNOSIS — J441 Chronic obstructive pulmonary disease with (acute) exacerbation: Secondary | ICD-10-CM

## 2014-02-26 HISTORY — DX: Obstructive sleep apnea (adult) (pediatric): G47.33

## 2014-02-26 NOTE — Telephone Encounter (Signed)
Message left on vm 

## 2014-02-26 NOTE — Telephone Encounter (Signed)
Seth Bake, Will you please ask patient who provides her CPAP supplies.  I would like to send her supplier a new order for a new pressure and to ensure she has the best fitting face mask.

## 2014-02-28 MED ORDER — AMBULATORY NON FORMULARY MEDICATION: Status: DC

## 2014-02-28 NOTE — Telephone Encounter (Signed)
Pt called back and uses [facility].

## 2014-03-07 NOTE — Telephone Encounter (Signed)
I gave info and order to Rose [name] to complete

## 2014-03-15 ENCOUNTER — Other Ambulatory Visit: Payer: Self-pay | Admitting: Family Medicine

## 2014-03-16 ENCOUNTER — Other Ambulatory Visit (HOSPITAL_COMMUNITY)
Admission: RE | Admit: 2014-03-16 | Discharge: 2014-03-16 | Disposition: A | Discharge status: Home / Self Care | Payer: BC Managed Care – PPO | Source: Ambulatory Visit | Attending: Unknown Physician Specialty | Admitting: Unknown Physician Specialty

## 2014-03-16 HISTORY — PX: OTHER SURGICAL HISTORY: SHX169

## 2014-03-19 ENCOUNTER — Telehealth: Payer: Self-pay | Admitting: *Deleted

## 2014-03-19 DIAGNOSIS — G4730 Sleep apnea, unspecified: Secondary | ICD-10-CM

## 2014-03-19 DIAGNOSIS — J441 Chronic obstructive pulmonary disease with (acute) exacerbation: Secondary | ICD-10-CM

## 2014-03-19 DIAGNOSIS — Z9989 Dependence on other enabling machines and devices: Principal | ICD-10-CM

## 2014-03-19 DIAGNOSIS — G4733 Obstructive sleep apnea (adult) (pediatric): Secondary | ICD-10-CM

## 2014-03-19 DIAGNOSIS — J449 Chronic obstructive pulmonary disease, unspecified: Secondary | ICD-10-CM

## 2014-03-19 MED ORDER — AMBULATORY NON FORMULARY MEDICATION: Status: AC

## 2014-03-19 NOTE — Telephone Encounter (Signed)
Pt is inquiring about CPAP supplies. Reprinted the order and asked Jenn to check on this in the am,pt notified

## 2014-03-21 ENCOUNTER — Other Ambulatory Visit: Payer: Self-pay | Admitting: Family Medicine

## 2014-03-21 DIAGNOSIS — G4733 Obstructive sleep apnea (adult) (pediatric): Secondary | ICD-10-CM

## 2014-09-20 ENCOUNTER — Other Ambulatory Visit: Payer: Self-pay | Admitting: Family Medicine

## 2016-03-04 HISTORY — PX: COLONOSCOPY: SHX174

## 2017-03-06 HISTORY — PX: APPENDECTOMY: SHX54

## 2017-06-22 DIAGNOSIS — R110 Nausea: Secondary | ICD-10-CM

## 2017-06-22 DIAGNOSIS — G4709 Other insomnia: Secondary | ICD-10-CM

## 2017-06-22 DIAGNOSIS — B0223 Postherpetic polyneuropathy: Secondary | ICD-10-CM

## 2017-06-22 DIAGNOSIS — I776 Arteritis, unspecified: Secondary | ICD-10-CM

## 2017-06-22 DIAGNOSIS — C8590 Non-Hodgkin lymphoma, unspecified, unspecified site: Secondary | ICD-10-CM

## 2017-06-22 DIAGNOSIS — Z681 Body mass index (BMI) 19 or less, adult: Secondary | ICD-10-CM | POA: Insufficient documentation

## 2017-06-22 DIAGNOSIS — F419 Anxiety disorder, unspecified: Secondary | ICD-10-CM

## 2017-06-22 DIAGNOSIS — K219 Gastro-esophageal reflux disease without esophagitis: Secondary | ICD-10-CM | POA: Insufficient documentation

## 2017-06-22 HISTORY — DX: Gastro-esophageal reflux disease without esophagitis: K21.9

## 2017-06-22 HISTORY — DX: Anxiety disorder, unspecified: F41.9

## 2017-06-22 HISTORY — DX: Arteritis, unspecified: I77.6

## 2017-06-22 HISTORY — DX: Other insomnia: G47.09

## 2017-06-22 HISTORY — DX: Postherpetic polyneuropathy: B02.23

## 2017-06-22 HISTORY — DX: Nausea: R11.0

## 2017-06-22 HISTORY — DX: Non-Hodgkin lymphoma, unspecified, unspecified site: C85.90

## 2017-06-22 HISTORY — DX: Body mass index (BMI) 19.9 or less, adult: Z68.1

## 2017-06-28 ENCOUNTER — Ambulatory Visit (HOSPITAL_BASED_OUTPATIENT_CLINIC_OR_DEPARTMENT_OTHER)
Admission: RE | Admit: 2017-06-28 | Discharge: 2017-06-28 | Disposition: A | Discharge status: Home / Self Care | Payer: BLUE CROSS/BLUE SHIELD | Source: Ambulatory Visit | Attending: Cardiology | Admitting: Cardiology

## 2017-06-28 ENCOUNTER — Ambulatory Visit: Payer: BLUE CROSS/BLUE SHIELD | Admitting: Cardiology

## 2017-06-28 ENCOUNTER — Encounter: Payer: Self-pay | Admitting: Cardiology

## 2017-06-28 VITALS — BP 118/80 | HR 80 | Ht 65.0 in | Wt 101.1 lb

## 2017-06-28 DIAGNOSIS — C8593 Non-Hodgkin lymphoma, unspecified, intra-abdominal lymph nodes: Secondary | ICD-10-CM

## 2017-06-28 DIAGNOSIS — R079 Chest pain, unspecified: Secondary | ICD-10-CM | POA: Insufficient documentation

## 2017-06-28 DIAGNOSIS — I10 Essential (primary) hypertension: Secondary | ICD-10-CM | POA: Diagnosis not present

## 2017-06-28 DIAGNOSIS — J449 Chronic obstructive pulmonary disease, unspecified: Secondary | ICD-10-CM | POA: Diagnosis not present

## 2017-06-28 NOTE — Progress Notes (Signed)
Cardiology Office Note:    Date:  06/28/2017   ID:  Rose [name], DOB 09-Jan-1966, MRN [id number]  PCP:  Rose Nakai, MD  Cardiologist:  Rose More, MD   Referring MD: Rose Pacas, DO  ASSESSMENT:    1. Chest pain, unspecified type   2. Hypertension, benign   3. Non-Hodgkin lymphoma of intra-abdominal lymph nodes, unspecified non-Hodgkin lymphoma type ([place])    PLAN:    In order of problems listed above:  1. Symptoms are consistent with angina referral for coronary angiography in 48 hours 2. Stable blood pressure control without antihypertensives 3. Managed by her oncologist [facility]  Next appointment in 2 weeks   Medication Adjustments/Labs and Tests Ordered: Current medicines are reviewed at length with the patient today.  Concerns regarding medicines are outlined above.  Orders Placed This Encounter  Procedures  . DG Chest 2 View  . INR/PT  . CBC  . Basic Metabolic Panel (BMET)  . EKG 12-Lead   No orders of the defined types were placed in this encounter.    Chief Complaint  Patient presents with  . Chest Pain    CAC on CT scan    History of Present Illness:    Rose [name] is a 52 y.o. female with NHL who is being seen today for the evaluation of chest pain and CAC seen on recent CT chest and abdomen at the request of [name], Sean, DO. She has a history of a non-Hodgkin's lymphoma and has had disease progression and may require additional treatment.  She has had previous XRT.  She is lost 30 pounds since November and a total of 60 pounds since her diagnosis.  She has emphysema and is chronic exertional shortness of breath and a chronic cough.  She was noted on CT scan to have coronary artery calcification.  Recently she has developed a pattern of exertional chest discomfort substernal it occurs when she be walking quickly doing housework it was severe limiting causes her to rest and has relief in 5-10 minutes.  The symptoms have  progressed and now she has frequent episodes at rest relieved with rest.  So far it has not awakened her from her sleep.  She underwent a stress test 10 years ago pharmacologically that was unremarkable.  She also has sleep apnea sleeps with CPAP but has to sleep on 2 pillows and has no history of heart failure or edema.  She has no background history of heart disease and has had no palpitation or syncope. Because of typical symptoms of angina coronary artery calcification and rapid progression symptoms consistent with unstable angina I advised her to undergo coronary angiography.  She has no dye allergy has normal renal function and although the chart says that she has allergies she tolerates aspirin.  After risk options and benefits were detailed she elects to undergo coronary angiography  Past Medical History:  Diagnosis Date  . Adult BMI <19 kg/sq m 06/22/2017  . ALLERGIC RHINITIS 11/05/2009   Qualifier: Diagnosis of  By: Rose [name]    . Anxiety 06/22/2017  . Arteritis ([place]) 06/22/2017  . Chronic idiopathic constipation 09/05/2012  . Chronic nausea 06/22/2017  . CHRONIC OBSTRUCTIVE PULMONARY DISEASE, ACUTE EXACERBATION 10/17/2009   Qualifier: Diagnosis of  By: Rose [name]    . COMMON MIGRAINE 08/17/2008   Qualifier: Diagnosis of  By: Rose [name]    . COPD 05/24/2007   Qualifier: Diagnosis of  By: Rose [name]    .  COPD GOLD II 05/24/2007   Refuses any inhalers with black box warning of sudden cardiac death - spirometry 2013-07-02 FEV1  2.07 ( 74%) ratio 69 so GOLD II barely but still smoking     . Cough 06/10/2013   Followed in Pulmonary clinic/ [facility]/ [facility]    . DEPRESSION 05/24/2007   Qualifier: Diagnosis of  By: Rose [name]    . Depression 05/24/2007  . Dysuria 01/06/2010   Qualifier: Diagnosis of  By: Rose [name]    . FATIGUE 01/06/2010   Qualifier: Diagnosis of  By: Rose [name]    . GERD without esophagitis 06/22/2017  . Hemoptysis 06/10/2013   Followed in  Pulmonary clinic/ [facility]/ [facility]    . Hyperlipidemia LDL goal < 160 03-Jul-2007   [place] Risk 3% Dec 2013    . Hypertension, benign 04/18/2007  . Impaired fasting glucose 09/17/2009   Qualifier: Diagnosis of  By: Rose Riddle MD, Rose [name]    . Non-Hodgkin lymphoma ([place]) 06/22/2017  . OSA (obstructive sleep apnea)    on CPAP   . OSA on CPAP 02/26/2014   CPAP titration to 13 CWP, AHI 0 per hour. She wore an extra small ResMed AirFit F-10 fullface mask with heated humidifier   . Other insomnia 06/22/2017  . PANIC DISORDER 04/18/2007   Qualifier: Diagnosis of  By: Rose Ramp MD, Rose    . Polymyalgia rheumatica ([name]) 12/02/2012   Taper begun November 2014,  7.5 --> 5mg  August 2015   . Shingles (herpes zoster) polyneuropathy 06/22/2017  . Sleep apnea 05/24/2007   Qualifier: Diagnosis of  By: Rose [name]    . SMOKER 04/18/2007   Qualifier: Diagnosis of  By: Rose Ramp MD, Rose    . 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 ABDOMINAL HYSTERECTOMY  2004  . BLADDER SURGERY  2004  . PLACEMENT OF BREAST IMPLANTS  1998  . TUBAL LIGATION  1988    Current Medications: Current Meds  Medication Sig  . albuterol (PROVENTIL HFA;VENTOLIN HFA) 108 (90 BASE) MCG/ACT inhaler Inhale two puffs every 4-6 hours only as needed for shortness of breath or wheezing.  Marland Kitchen albuterol (PROVENTIL) (2.5 MG/3ML) 0.083% nebulizer solution Take 3 mLs (2.5 mg total) by nebulization every 6 (six) hours as needed for wheezing.  Marland Kitchen ALPRAZolam (XANAX) 0.25 MG tablet TAKE 1 TABLET BY MOUTH TWICE DAILY AS NEEDED FOR SLEEP  . AMBULATORY NON FORMULARY MEDICATION Cpap titration and mask fitting Dx sleep apnea  . atorvastatin (LIPITOR) 10 MG tablet TK 1 T PO QD  . buPROPion (WELLBUTRIN SR) 150 MG 12 hr tablet TK 1 T PO BID  . predniSONE (DELTASONE) 5 MG tablet Continue taper with 1 tab daily.  Marland Kitchen topiramate (TOPAMAX) 100 MG tablet TAKE 1 TABLET  BY MOUTH TWICE DAILY  . TRINTELLIX 10 MG TABS tablet TK 1 T PO D     Allergies:   Naproxen and Niacin   Social History   Socioeconomic History  . Marital status: Married    Spouse name: Not on file  . Number of children: Not on file  . Years of education: Not on file  . Highest education level: Not on file  Occupational History  . Occupation: Agricultural consultant: [employer]  Social Needs  . Financial resource strain: Not on file  . Food insecurity:    Worry: Not on file  Inability: Not on file  . Transportation needs:    Medical: Not on file    Non-medical: Not on file  Tobacco Use  . Smoking status: Current Every Day Smoker    Packs/day: 0.50    Years: 37.00    Pack years: 18.50    Types: Cigarettes  . Smokeless tobacco: Never Used  . Tobacco comment: using vape   Substance and Sexual Activity  . Alcohol use: No  . Drug use: No  . Sexual activity: Not on file  Lifestyle  . Physical activity:    Days per week: Not on file    Minutes per session: Not on file  . Stress: Not on file  Relationships  . Social connections:    Talks on phone: Not on file    Gets together: Not on file    Attends religious service: Not on file    Active member of club or organization: Not on file    Attends meetings of clubs or organizations: Not on file    Relationship status: Not on file  Other Topics Concern  . Not on file  Social History Narrative  . Not on file     Family History: The patient's family history includes Anemia in her mother; Breast cancer in her mother; Heart attack in her brother, father, maternal grandfather, paternal grandfather, and sister; Heart disease in her brother, father, maternal grandfather, paternal grandfather, and sister; Hyperlipidemia in her brother, father, maternal grandfather, maternal grandmother, mother, paternal grandfather, paternal grandmother, and sister; Hypertension in her brother, father, maternal grandfather, maternal  grandmother, mother, paternal grandfather, paternal grandmother, and sister; Prostate cancer in her brother and father; Skin cancer in her father; Stroke in her maternal grandfather.  ROS:   Review of Systems  Constitution: Positive for night sweats and weight loss (30 lbs sincce November).  HENT: Negative.   Eyes: Negative.   Cardiovascular: Positive for chest pain, dyspnea on exertion and orthopnea.  Respiratory: Positive for cough and shortness of breath (uses CPAP).   Endocrine: Negative.   Hematologic/Lymphatic: Negative.   Skin: Negative.   Musculoskeletal: Negative.   Gastrointestinal: Positive for abdominal pain.  Genitourinary: Negative.   Neurological: Negative.   Psychiatric/Behavioral: Negative.   Allergic/Immunologic: Negative.    Please see the history of present illness.     All other systems reviewed and are negative.  EKGs/Labs/Other Studies Reviewed:    The following studies were reviewed today:   EKG:  EKG is  ordered today.  The ekg ordered today demonstrates [place] normal  Recent Labs: 06/16/2017 CBC is normal platelets 153,000 CMP is normal No results found for requested labs within last 8760 hours.  Recent Lipid Panel    Component Value Date/Time   CHOL 203 (H) 03/22/2012 1038   TRIG 162 (H) 03/22/2012 1038   HDL 57 03/22/2012 1038   CHOLHDL 3.6 03/22/2012 1038   VLDL 32 03/22/2012 1038   LDLCALC 114 (H) 03/22/2012 1038    Physical Exam:    VS:  BP 118/80 (BP Location: Right Arm, Patient Position: Sitting, Cuff Size: Normal)   Pulse 80   Ht 5\' 5"  (1.651 m)   Wt 101 lb 1.9 oz (45.9 kg)   SpO2 99%   BMI 16.83 kg/m     Wt Readings from Last 3 Encounters:  06/28/17 101 lb 1.9 oz (45.9 kg)  02/21/14 163 lb (73.9 kg)  12/26/13 162 lb (73.5 kg)     GEN: looks chronically ill n no acute distress HEENT: Normal  NECK: No JVD; No carotid bruits LYMPHATICS: No lymphadenopathy CARDIAC: RRR, no murmurs, rubs, gallops RESPIRATORY:  Clear to  auscultation without rales, wheezing or rhonchi  ABDOMEN: Soft, non-tender, non-distended MUSCULOSKELETAL:  No edema; No deformity  SKIN: Warm and dry NEUROLOGIC:  Alert and oriented x 3 PSYCHIATRIC:  Normal affect     Signed, Rose More, MD  06/28/2017 3:28 PM    [facility]

## 2017-06-28 NOTE — Progress Notes (Deleted)
Cardiology Office Note:    Date:  06/28/2017   ID:  Rose [name], DOB 04-03-1966, MRN [id number]  PCP:  Rose Nakai, MD  Cardiologist:  Rose More, MD   Referring MD: Rose Pacas, DO  ASSESSMENT:    No diagnosis found. PLAN:    In order of problems listed above:  1. ***  Next appointment   Medication Adjustments/Labs and Tests Ordered: Current medicines are reviewed at length with the patient today.  Concerns regarding medicines are outlined above.  No orders of the defined types were placed in this encounter.  No orders of the defined types were placed in this encounter.    Chief Complaint  Patient presents with  . Chest Pain    CAC  ***  History of Present Illness:    Rose [name] is a 52 y.o. female with NH Lymphoma who is being seen today for the evaluation of CAC on CT scan.at the request of Rose Pacas, DO.   Past Medical History:  Diagnosis Date  . Adult BMI <19 kg/sq m 06/22/2017  . ALLERGIC RHINITIS 11/05/2009   Qualifier: Diagnosis of  By: [name]    . Anxiety 06/22/2017  . Arteritis ([place]) 06/22/2017  . Chronic idiopathic constipation 09/05/2012  . Chronic nausea 06/22/2017  . CHRONIC OBSTRUCTIVE PULMONARY DISEASE, ACUTE EXACERBATION 10/17/2009   Qualifier: Diagnosis of  By: [name]    . COMMON MIGRAINE 08/17/2008   Qualifier: Diagnosis of  By: [name]    . COPD 05/24/2007   Qualifier: Diagnosis of  By: [name]    . COPD GOLD II 05/24/2007   Refuses any inhalers with black box warning of sudden cardiac death - spirometry 07-10-2013 FEV1  2.07 ( 74%) ratio 69 so GOLD II barely but still smoking     . Cough 06/10/2013   Followed in Pulmonary clinic/ [facility]/ [facility]    . DEPRESSION 05/24/2007   Qualifier: Diagnosis of  By: [name]    . Depression 05/24/2007  . Dysuria 01/06/2010   Qualifier: Diagnosis of  By: [name]    . FATIGUE 01/06/2010   Qualifier: Diagnosis of  By: [name]    .  GERD without esophagitis 06/22/2017  . Hemoptysis 06/10/2013   Followed in Pulmonary clinic/ [facility]/ [facility]    . Hyperlipidemia LDL goal < 160 11-Jul-2007   [place] Risk 3% Dec 2013    . Hypertension, benign 04/18/2007  . Impaired fasting glucose 09/17/2009   Qualifier: Diagnosis of  By: Birdie Riddle MD, Belenda Cruise    . Non-Hodgkin lymphoma ([place]) 06/22/2017  . OSA (obstructive sleep apnea)    on CPAP   . OSA on CPAP 02/26/2014   CPAP titration to 13 CWP, AHI 0 per hour. She wore an extra small ResMed AirFit F-10 fullface mask with heated humidifier   . Other insomnia 06/22/2017  . PANIC DISORDER 04/18/2007   Qualifier: Diagnosis of  By: Ronnald Ramp MD, Lauren    . Polymyalgia rheumatica ([place]) 12/02/2012   Taper begun November 2014,  7.5 --> 5mg  August 2015   . Shingles (herpes zoster) polyneuropathy 06/22/2017  . Sleep apnea 05/24/2007   Qualifier: Diagnosis of  By: [name]    . SMOKER 04/18/2007   Qualifier: Diagnosis of  By: Ronnald Ramp MD, Lauren    . THYROMEGALY 08/17/2007   Qualifier: Diagnosis of  By: [name]      Past Surgical History:  Procedure Laterality Date  .  ABDOMINAL HYSTERECTOMY  2004  . BLADDER SURGERY  2004  . PLACEMENT OF BREAST IMPLANTS  1998  . TUBAL LIGATION  1988    Current Medications: Current Meds  Medication Sig  . albuterol (PROVENTIL HFA;VENTOLIN HFA) 108 (90 BASE) MCG/ACT inhaler Inhale two puffs every 4-6 hours only as needed for shortness of breath or wheezing.  Marland Kitchen albuterol (PROVENTIL) (2.5 MG/3ML) 0.083% nebulizer solution Take 3 mLs (2.5 mg total) by nebulization every 6 (six) hours as needed for wheezing.  Marland Kitchen ALPRAZolam (XANAX) 0.25 MG tablet TAKE 1 TABLET BY MOUTH TWICE DAILY AS NEEDED FOR SLEEP  . AMBULATORY NON FORMULARY MEDICATION Cpap titration and mask fitting Dx sleep apnea  . atorvastatin (LIPITOR) 10 MG tablet TK 1 T PO QD  . buPROPion (WELLBUTRIN SR) 150 MG 12 hr tablet TK 1 T PO BID  . predniSONE (DELTASONE) 5 MG tablet Continue  taper with 1 tab daily.  Marland Kitchen topiramate (TOPAMAX) 100 MG tablet TAKE 1 TABLET BY MOUTH TWICE DAILY  . TRINTELLIX 10 MG TABS tablet TK 1 T PO D     Allergies:   Naproxen and Niacin   Social History   Socioeconomic History  . Marital status: Married    Spouse name: Not on file  . Number of children: Not on file  . Years of education: Not on file  . Highest education level: Not on file  Occupational History  . Occupation: Agricultural consultant: [employer]  Social Needs  . Financial resource strain: Not on file  . Food insecurity:    Worry: Not on file    Inability: Not on file  . Transportation needs:    Medical: Not on file    Non-medical: Not on file  Tobacco Use  . Smoking status: Current Every Day Smoker    Packs/day: 0.50    Years: 37.00    Pack years: 18.50    Types: Cigarettes  . Smokeless tobacco: Never Used  . Tobacco comment: using vape   Substance and Sexual Activity  . Alcohol use: No  . Drug use: No  . Sexual activity: Not on file  Lifestyle  . Physical activity:    Days per week: Not on file    Minutes per session: Not on file  . Stress: Not on file  Relationships  . Social connections:    Talks on phone: Not on file    Gets together: Not on file    Attends religious service: Not on file    Active member of club or organization: Not on file    Attends meetings of clubs or organizations: Not on file    Relationship status: Not on file  Other Topics Concern  . Not on file  Social History Narrative  . Not on file     Family History: The patient's ***family history includes Anemia in her mother; Breast cancer in her mother; Heart attack in her brother, father, maternal grandfather, paternal grandfather, and sister; Heart disease in her brother, father, maternal grandfather, paternal grandfather, and sister; Hyperlipidemia in her brother, father, maternal grandfather, maternal grandmother, mother, paternal grandfather, paternal grandmother, and  sister; Hypertension in her brother, father, maternal grandfather, maternal grandmother, mother, paternal grandfather, paternal grandmother, and sister; Prostate cancer in her brother and father; Skin cancer in her father; Stroke in her maternal grandfather.  ROS:   ROS Please see the history of present illness.    *** All other systems reviewed and are negative.  EKGs/Labs/Other Studies Reviewed:  The following studies were reviewed today: ***  EKG:  EKG is *** ordered today.  The ekg ordered today demonstrates ***  Recent Labs:   05/01/17 CMP normal No results found for requested labs within last 8760 hours.  Recent Lipid Panel  05/01/17 Chol 181 HDL 79 LDL 85    Component Value Date/Time   CHOL 203 (H) 03/22/2012 1038   TRIG 162 (H) 03/22/2012 1038   HDL 57 03/22/2012 1038   CHOLHDL 3.6 03/22/2012 1038   VLDL 32 03/22/2012 1038   LDLCALC 114 (H) 03/22/2012 1038    Physical Exam:    VS:  BP 118/80 (BP Location: Right Arm, Patient Position: Sitting, Cuff Size: Normal)   Pulse 80   Ht 5\' 5"  (1.651 m)   Wt 101 lb 1.9 oz (45.9 kg)   SpO2 99%   BMI 16.83 kg/m     Wt Readings from Last 3 Encounters:  06/28/17 101 lb 1.9 oz (45.9 kg)  02/21/14 163 lb (73.9 kg)  12/26/13 162 lb (73.5 kg)     GEN: *** Well nourished, well developed in no acute distress HEENT: Normal NECK: No JVD; No carotid bruits LYMPHATICS: No lymphadenopathy CARDIAC: ***RRR, no murmurs, rubs, gallops RESPIRATORY:  Clear to auscultation without rales, wheezing or rhonchi  ABDOMEN: Soft, non-tender, non-distended MUSCULOSKELETAL:  No edema; No deformity  SKIN: Warm and dry NEUROLOGIC:  Alert and oriented x 3 PSYCHIATRIC:  Normal affect     Signed, Rose More, MD  06/28/2017 2:57 PM    [facility]

## 2017-06-28 NOTE — H&P (View-Only) (Signed)
Cardiology Office Note:    Date:  06/28/2017   ID:  Rose [name], DOB Dec 12, 1965, MRN [id number]  PCP:  Cher Nakai, MD  Cardiologist:  Shirlee More, MD   Referring MD: Marcial Pacas, DO  ASSESSMENT:    1. Chest pain, unspecified type   2. Hypertension, benign   3. Non-Hodgkin lymphoma of intra-abdominal lymph nodes, unspecified non-Hodgkin lymphoma type ([place])    PLAN:    In order of problems listed above:  1. Symptoms are consistent with angina referral for coronary angiography in 48 hours 2. Stable blood pressure control without antihypertensives 3. Managed by her oncologist [facility]  Next appointment in 2 weeks   Medication Adjustments/Labs and Tests Ordered: Current medicines are reviewed at length with the patient today.  Concerns regarding medicines are outlined above.  Orders Placed This Encounter  Procedures  . DG Chest 2 View  . INR/PT  . CBC  . Basic Metabolic Panel (BMET)  . EKG 12-Lead   No orders of the defined types were placed in this encounter.    Chief Complaint  Patient presents with  . Chest Pain    CAC on CT scan    History of Present Illness:    Rose [name] is a 52 y.o. female with NHL who is being seen today for the evaluation of chest pain and CAC seen on recent CT chest and abdomen at the request of [place], Sean, DO. She has a history of a non-Hodgkin's lymphoma and has had disease progression and may require additional treatment.  She has had previous XRT.  She is lost 30 pounds since November and a total of 60 pounds since her diagnosis.  She has emphysema and is chronic exertional shortness of breath and a chronic cough.  She was noted on CT scan to have coronary artery calcification.  Recently she has developed a pattern of exertional chest discomfort substernal it occurs when she be walking quickly doing housework it was severe limiting causes her to rest and has relief in 5-10 minutes.  The symptoms have  progressed and now she has frequent episodes at rest relieved with rest.  So far it has not awakened her from her sleep.  She underwent a stress test 10 years ago pharmacologically that was unremarkable.  She also has sleep apnea sleeps with CPAP but has to sleep on 2 pillows and has no history of heart failure or edema.  She has no background history of heart disease and has had no palpitation or syncope. Because of typical symptoms of angina coronary artery calcification and rapid progression symptoms consistent with unstable angina I advised her to undergo coronary angiography.  She has no dye allergy has normal renal function and although the chart says that she has allergies she tolerates aspirin.  After risk options and benefits were detailed she elects to undergo coronary angiography  Past Medical History:  Diagnosis Date  . Adult BMI <19 kg/sq m 06/22/2017  . ALLERGIC RHINITIS 11/05/2009   Qualifier: Diagnosis of  By: [name]    . Anxiety 06/22/2017  . Arteritis ([place]) 06/22/2017  . Chronic idiopathic constipation 09/05/2012  . Chronic nausea 06/22/2017  . CHRONIC OBSTRUCTIVE PULMONARY DISEASE, ACUTE EXACERBATION 10/17/2009   Qualifier: Diagnosis of  By: [name]    . COMMON MIGRAINE 08/17/2008   Qualifier: Diagnosis of  By: [name]    . COPD 05/24/2007   Qualifier: Diagnosis of  By: [name]    .  COPD GOLD II 05/24/2007   Refuses any inhalers with black box warning of sudden cardiac death - spirometry 07-03-2013 FEV1  2.07 ( 74%) ratio 69 so GOLD II barely but still smoking     . Cough 06/10/2013   Followed in Pulmonary clinic/ [facility]/ [facility]    . DEPRESSION 05/24/2007   Qualifier: Diagnosis of  By: [name]    . Depression 05/24/2007  . Dysuria 01/06/2010   Qualifier: Diagnosis of  By: [name]    . FATIGUE 01/06/2010   Qualifier: Diagnosis of  By: [name]    . GERD without esophagitis 06/22/2017  . Hemoptysis 06/10/2013   Followed in  Pulmonary clinic/ [facility]/ [facility]    . Hyperlipidemia LDL goal < 160 07/04/2007   [place] Risk 3% Dec 2013    . Hypertension, benign 04/18/2007  . Impaired fasting glucose 09/17/2009   Qualifier: Diagnosis of  By: Birdie Riddle MD, Belenda Cruise    . Non-Hodgkin lymphoma ([place]) 06/22/2017  . OSA (obstructive sleep apnea)    on CPAP   . OSA on CPAP 02/26/2014   CPAP titration to 13 CWP, AHI 0 per hour. She wore an extra small ResMed AirFit F-10 fullface mask with heated humidifier   . Other insomnia 06/22/2017  . PANIC DISORDER 04/18/2007   Qualifier: Diagnosis of  By: Ronnald Ramp MD, Lauren    . Polymyalgia rheumatica ([place]) 12/02/2012   Taper begun November 2014,  7.5 --> 5mg  August 2015   . Shingles (herpes zoster) polyneuropathy 06/22/2017  . Sleep apnea 05/24/2007   Qualifier: Diagnosis of  By: [name]    . SMOKER 04/18/2007   Qualifier: Diagnosis of  By: Ronnald Ramp MD, Lauren    . THYROMEGALY 08/17/2007   Qualifier: Diagnosis of  By: [name]      Past Surgical History:  Procedure Laterality Date  . ABDOMINAL HYSTERECTOMY  2004  . BLADDER SURGERY  2004  . PLACEMENT OF BREAST IMPLANTS  1998  . TUBAL LIGATION  1988    Current Medications: Current Meds  Medication Sig  . albuterol (PROVENTIL HFA;VENTOLIN HFA) 108 (90 BASE) MCG/ACT inhaler Inhale two puffs every 4-6 hours only as needed for shortness of breath or wheezing.  Marland Kitchen albuterol (PROVENTIL) (2.5 MG/3ML) 0.083% nebulizer solution Take 3 mLs (2.5 mg total) by nebulization every 6 (six) hours as needed for wheezing.  Marland Kitchen ALPRAZolam (XANAX) 0.25 MG tablet TAKE 1 TABLET BY MOUTH TWICE DAILY AS NEEDED FOR SLEEP  . AMBULATORY NON FORMULARY MEDICATION Cpap titration and mask fitting Dx sleep apnea  . atorvastatin (LIPITOR) 10 MG tablet TK 1 T PO QD  . buPROPion (WELLBUTRIN SR) 150 MG 12 hr tablet TK 1 T PO BID  . predniSONE (DELTASONE) 5 MG tablet Continue taper with 1 tab daily.  Marland Kitchen topiramate (TOPAMAX) 100 MG tablet TAKE 1 TABLET  BY MOUTH TWICE DAILY  . TRINTELLIX 10 MG TABS tablet TK 1 T PO D     Allergies:   Naproxen and Niacin   Social History   Socioeconomic History  . Marital status: Married    Spouse name: Not on file  . Number of children: Not on file  . Years of education: Not on file  . Highest education level: Not on file  Occupational History  . Occupation: Agricultural consultant: [employer]  Social Needs  . Financial resource strain: Not on file  . Food insecurity:    Worry: Not on file  Inability: Not on file  . Transportation needs:    Medical: Not on file    Non-medical: Not on file  Tobacco Use  . Smoking status: Current Every Day Smoker    Packs/day: 0.50    Years: 37.00    Pack years: 18.50    Types: Cigarettes  . Smokeless tobacco: Never Used  . Tobacco comment: using vape   Substance and Sexual Activity  . Alcohol use: No  . Drug use: No  . Sexual activity: Not on file  Lifestyle  . Physical activity:    Days per week: Not on file    Minutes per session: Not on file  . Stress: Not on file  Relationships  . Social connections:    Talks on phone: Not on file    Gets together: Not on file    Attends religious service: Not on file    Active member of club or organization: Not on file    Attends meetings of clubs or organizations: Not on file    Relationship status: Not on file  Other Topics Concern  . Not on file  Social History Narrative  . Not on file     Family History: The patient's family history includes Anemia in her mother; Breast cancer in her mother; Heart attack in her brother, father, maternal grandfather, paternal grandfather, and sister; Heart disease in her brother, father, maternal grandfather, paternal grandfather, and sister; Hyperlipidemia in her brother, father, maternal grandfather, maternal grandmother, mother, paternal grandfather, paternal grandmother, and sister; Hypertension in her brother, father, maternal grandfather, maternal  grandmother, mother, paternal grandfather, paternal grandmother, and sister; Prostate cancer in her brother and father; Skin cancer in her father; Stroke in her maternal grandfather.  ROS:   Review of Systems  Constitution: Positive for night sweats and weight loss (30 lbs sincce November).  HENT: Negative.   Eyes: Negative.   Cardiovascular: Positive for chest pain, dyspnea on exertion and orthopnea.  Respiratory: Positive for cough and shortness of breath (uses CPAP).   Endocrine: Negative.   Hematologic/Lymphatic: Negative.   Skin: Negative.   Musculoskeletal: Negative.   Gastrointestinal: Positive for abdominal pain.  Genitourinary: Negative.   Neurological: Negative.   Psychiatric/Behavioral: Negative.   Allergic/Immunologic: Negative.    Please see the history of present illness.     All other systems reviewed and are negative.  EKGs/Labs/Other Studies Reviewed:    The following studies were reviewed today:   EKG:  EKG is  ordered today.  The ekg ordered today demonstrates [place] normal  Recent Labs: 06/16/2017 CBC is normal platelets 153,000 CMP is normal No results found for requested labs within last 8760 hours.  Recent Lipid Panel    Component Value Date/Time   CHOL 203 (H) 03/22/2012 1038   TRIG 162 (H) 03/22/2012 1038   HDL 57 03/22/2012 1038   CHOLHDL 3.6 03/22/2012 1038   VLDL 32 03/22/2012 1038   LDLCALC 114 (H) 03/22/2012 1038    Physical Exam:    VS:  BP 118/80 (BP Location: Right Arm, Patient Position: Sitting, Cuff Size: Normal)   Pulse 80   Ht 5\' 5"  (1.651 m)   Wt 101 lb 1.9 oz (45.9 kg)   SpO2 99%   BMI 16.83 kg/m     Wt Readings from Last 3 Encounters:  06/28/17 101 lb 1.9 oz (45.9 kg)  02/21/14 163 lb (73.9 kg)  12/26/13 162 lb (73.5 kg)     GEN: looks chronically ill n no acute distress HEENT: Normal  NECK: No JVD; No carotid bruits LYMPHATICS: No lymphadenopathy CARDIAC: RRR, no murmurs, rubs, gallops RESPIRATORY:  Clear to  auscultation without rales, wheezing or rhonchi  ABDOMEN: Soft, non-tender, non-distended MUSCULOSKELETAL:  No edema; No deformity  SKIN: Warm and dry NEUROLOGIC:  Alert and oriented x 3 PSYCHIATRIC:  Normal affect     Signed, Shirlee More, MD  06/28/2017 3:28 PM    [facility]

## 2017-06-28 NOTE — Patient Instructions (Signed)
Medication Instructions:  Your physician has recommended you make the following change in your medication:  START aspirin 81 mg daily  Labwork: Your physician recommends that you return for lab work today: INR, CBC, BMP.  Testing/Procedures: You had an EKG today.   A chest x-ray takes a picture of the organs and structures inside the chest, including the heart, lungs, and blood vessels. This test can show several things, including, whether the heart is enlarges; whether fluid is building up in the lungs; and whether pacemaker / defibrillator leads are still in place.    Rose [name] [place] [address] [place] [address] Dept: [phone] Loc: [place]  06/28/2017  You are scheduled for a Cardiac Catheterization on Wednesday, March 27 with Dr. Lauree Chandler.  1. Please arrive at the [facility] (Main Entrance A) at [facility]: [address] [place], [place] [address] at 12:30 PM (two hours before your procedure to ensure your preparation). Free valet parking service is available.   Special note: Every effort is made to have your procedure done on time. Please understand that emergencies sometimes delay scheduled procedures.  2. Diet: Do not eat or drink anything after midnight prior to your procedure except sips of water to take medications.  3. Labs: None needed.  4. Medication instructions in preparation for your procedure:   Current Outpatient Medications (Endocrine & Metabolic):  .  predniSONE (DELTASONE) 5 MG tablet, Continue taper with 1 tab daily.  Current Outpatient Medications (Cardiovascular):  .  atorvastatin (LIPITOR) 10 MG tablet, TK 1 T PO QD  Current Outpatient Medications (Respiratory):  .  albuterol (PROVENTIL HFA;VENTOLIN HFA) 108 (90 BASE) MCG/ACT inhaler, Inhale two puffs every 4-6 hours only as needed for shortness of breath  or wheezing. Marland Kitchen  albuterol (PROVENTIL) (2.5 MG/3ML) 0.083% nebulizer solution, Take 3 mLs (2.5 mg total) by nebulization every 6 (six) hours as needed for wheezing.    Current Outpatient Medications (Other):  Marland Kitchen  ALPRAZolam (XANAX) 0.25 MG tablet, TAKE 1 TABLET BY MOUTH TWICE DAILY AS NEEDED FOR SLEEP .  AMBULATORY NON FORMULARY MEDICATION, Cpap titration and mask fitting Dx sleep apnea .  buPROPion (WELLBUTRIN SR) 150 MG 12 hr tablet, TK 1 T PO BID .  topiramate (TOPAMAX) 100 MG tablet, TAKE 1 TABLET BY MOUTH TWICE DAILY .  TRINTELLIX 10 MG TABS tablet, TK 1 T PO D *For reference purposes while preparing patient instructions.   Delete this med list prior to printing instructions for patient.*   On the morning of your procedure, take your Aspirin and any morning medicines NOT listed above.  You may use sips of water.  5. Plan for one night stay--bring personal belongings. 6. Bring a current list of your medications and current insurance cards. 7. You MUST have a responsible person to drive you home. 8. Someone MUST be with you the first 24 hours after you arrive home or your discharge will be delayed. 9. Please wear clothes that are easy to get on and off and wear slip-on shoes.  Thank you for allowing [place] to care for you!   -- [facility]    Follow-Up: Your physician recommends that you schedule a follow-up appointment in: 2 weeks.  Any Other Special Instructions Will Be Listed Below (If Applicable).     If you need a refill on your cardiac medications before your next appointment, please call  your pharmacy.

## 2017-06-29 ENCOUNTER — Telehealth: Payer: Self-pay | Admitting: *Deleted

## 2017-06-29 LAB — CBC
HEMATOCRIT: 39.8 % (ref 34.0–46.6)
HEMOGLOBIN: 13.6 g/dL (ref 11.1–15.9)
MCH: 33.3 pg — AB (ref 26.6–33.0)
MCHC: 34.2 g/dL (ref 31.5–35.7)
MCV: 98 fL — AB (ref 79–97)
Platelets: 212 10*3/uL (ref 150–379)
RBC: 4.08 x10E6/uL (ref 3.77–5.28)
RDW: 13.8 % (ref 12.3–15.4)
WBC: 5.5 10*3/uL (ref 3.4–10.8)

## 2017-06-29 LAB — BASIC METABOLIC PANEL
BUN/Creatinine Ratio: 21 (ref 9–23)
BUN: 19 mg/dL (ref 6–24)
CO2: 22 mmol/L (ref 20–29)
CREATININE: 0.91 mg/dL (ref 0.57–1.00)
Calcium: 9.5 mg/dL (ref 8.7–10.2)
Chloride: 106 mmol/L (ref 96–106)
GFR, EST AFRICAN AMERICAN: 83 mL/min/{1.73_m2} (ref >59)
GFR, EST NON AFRICAN AMERICAN: 72 mL/min/{1.73_m2} (ref >59)
Glucose: 96 mg/dL (ref 65–99)
Potassium: 5.1 mmol/L (ref 3.5–5.2)
Sodium: 143 mmol/L (ref 134–144)

## 2017-06-29 LAB — PROTIME-INR
INR: 1 (ref 0.8–1.2)
PROTHROMBIN TIME: 10.2 s (ref 9.1–12.0)

## 2017-06-29 NOTE — Telephone Encounter (Signed)
Pt contacted pre-catheterization scheduled at [facility] for: Wednesday June 30, 2017 at 2:30 PM Verified arrival time and place: [facility] Main Entrance A/[facility] at: 12:30 PM Nothing to eat after midnight, clear liquids until 7 AM, then nothing to eat or drink except sips of water with medications Verified no diabetes medications. Verified allergies in Epic.  AM meds can be  taken pre-cath with sip of water including: ASA 81 mg  Confirmed patient has responsible person to drive home post procedure and observe patient for 24 hours: yes

## 2017-06-30 ENCOUNTER — Ambulatory Visit (HOSPITAL_COMMUNITY)
Admission: RE | Disposition: A | Discharge status: Home / Self Care | Payer: Self-pay | Source: Ambulatory Visit | Attending: Cardiovascular Disease

## 2017-06-30 ENCOUNTER — Encounter (HOSPITAL_COMMUNITY): Payer: Self-pay | Admitting: General Practice

## 2017-06-30 ENCOUNTER — Other Ambulatory Visit: Payer: Self-pay

## 2017-06-30 ENCOUNTER — Ambulatory Visit (HOSPITAL_COMMUNITY)
Admission: RE | Admit: 2017-06-30 | Discharge: 2017-07-01 | Disposition: A | Discharge status: Home / Self Care | Payer: BLUE CROSS/BLUE SHIELD | Source: Ambulatory Visit | Attending: Cardiovascular Disease | Admitting: Cardiovascular Disease

## 2017-06-30 DIAGNOSIS — F17210 Nicotine dependence, cigarettes, uncomplicated: Secondary | ICD-10-CM | POA: Diagnosis not present

## 2017-06-30 DIAGNOSIS — I10 Essential (primary) hypertension: Secondary | ICD-10-CM | POA: Insufficient documentation

## 2017-06-30 DIAGNOSIS — I25110 Atherosclerotic heart disease of native coronary artery with unstable angina pectoris: Secondary | ICD-10-CM

## 2017-06-30 DIAGNOSIS — I2584 Coronary atherosclerosis due to calcified coronary lesion: Secondary | ICD-10-CM | POA: Diagnosis not present

## 2017-06-30 DIAGNOSIS — J449 Chronic obstructive pulmonary disease, unspecified: Secondary | ICD-10-CM | POA: Diagnosis not present

## 2017-06-30 DIAGNOSIS — G4733 Obstructive sleep apnea (adult) (pediatric): Secondary | ICD-10-CM | POA: Diagnosis not present

## 2017-06-30 DIAGNOSIS — Z79899 Other long term (current) drug therapy: Secondary | ICD-10-CM | POA: Diagnosis not present

## 2017-06-30 DIAGNOSIS — I200 Unstable angina: Secondary | ICD-10-CM | POA: Diagnosis present

## 2017-06-30 DIAGNOSIS — E785 Hyperlipidemia, unspecified: Secondary | ICD-10-CM | POA: Diagnosis present

## 2017-06-30 DIAGNOSIS — Z8572 Personal history of non-Hodgkin lymphomas: Secondary | ICD-10-CM | POA: Insufficient documentation

## 2017-06-30 DIAGNOSIS — Z7952 Long term (current) use of systemic steroids: Secondary | ICD-10-CM | POA: Insufficient documentation

## 2017-06-30 DIAGNOSIS — F17200 Nicotine dependence, unspecified, uncomplicated: Secondary | ICD-10-CM | POA: Diagnosis present

## 2017-06-30 DIAGNOSIS — Z886 Allergy status to analgesic agent status: Secondary | ICD-10-CM | POA: Insufficient documentation

## 2017-06-30 DIAGNOSIS — Z955 Presence of coronary angioplasty implant and graft: Secondary | ICD-10-CM

## 2017-06-30 DIAGNOSIS — R079 Chest pain, unspecified: Secondary | ICD-10-CM

## 2017-06-30 HISTORY — PX: CORONARY STENT INTERVENTION: CATH118234

## 2017-06-30 HISTORY — PX: CORONARY ANGIOPLASTY WITH STENT PLACEMENT: SHX49

## 2017-06-30 HISTORY — DX: Personal history of other diseases of the digestive system: Z87.19

## 2017-06-30 HISTORY — DX: Atherosclerotic heart disease of native coronary artery without angina pectoris: I25.10

## 2017-06-30 HISTORY — PX: LEFT HEART CATH AND CORONARY ANGIOGRAPHY: CATH118249

## 2017-06-30 HISTORY — DX: Personal history of urinary calculi: Z87.442

## 2017-06-30 HISTORY — DX: Migraine, unspecified, not intractable, without status migrainosus: G43.909

## 2017-06-30 HISTORY — DX: Unspecified chronic bronchitis: J42

## 2017-06-30 LAB — POCT ACTIVATED CLOTTING TIME: ACTIVATED CLOTTING TIME: 296 s

## 2017-06-30 SURGERY — LEFT HEART CATH AND CORONARY ANGIOGRAPHY
Anesthesia: LOCAL

## 2017-06-30 MED ORDER — LIDOCAINE HCL (PF) 1 % IJ SOLN
INTRAMUSCULAR | Status: AC
  Filled 2017-06-30: qty 30

## 2017-06-30 MED ORDER — IOPAMIDOL (ISOVUE-370) INJECTION 76%
INTRAVENOUS | Status: AC
  Filled 2017-06-30: qty 50

## 2017-06-30 MED ORDER — MIDAZOLAM HCL 2 MG/2ML IJ SOLN
INTRAMUSCULAR | Status: AC
  Filled 2017-06-30: qty 2

## 2017-06-30 MED ORDER — CLOPIDOGREL BISULFATE 300 MG PO TABS
ORAL_TABLET | ORAL | Status: DC | PRN
  Administered 2017-06-30: 600 mg via ORAL

## 2017-06-30 MED ORDER — BUPROPION HCL ER (SR) 150 MG PO TB12
150.0000 mg | ORAL_TABLET | Freq: Every day | ORAL | Status: DC
  Filled 2017-06-30: qty 1

## 2017-06-30 MED ORDER — SODIUM CHLORIDE 0.9% FLUSH: 3.0000 mL | INTRAVENOUS | Status: DC | PRN

## 2017-06-30 MED ORDER — HEPARIN SODIUM (PORCINE) 1000 UNIT/ML IJ SOLN
INTRAMUSCULAR | Status: AC
  Filled 2017-06-30: qty 1

## 2017-06-30 MED ORDER — HYDRALAZINE HCL 20 MG/ML IJ SOLN: 5.0000 mg | INTRAMUSCULAR | Status: AC | PRN

## 2017-06-30 MED ORDER — FENTANYL CITRATE (PF) 100 MCG/2ML IJ SOLN
INTRAMUSCULAR | Status: AC
  Filled 2017-06-30: qty 2

## 2017-06-30 MED ORDER — IOPAMIDOL (ISOVUE-370) INJECTION 76%
INTRAVENOUS | Status: DC | PRN
  Administered 2017-06-30: 165 mL

## 2017-06-30 MED ORDER — SODIUM CHLORIDE 0.9 % WEIGHT BASED INFUSION: 1.0000 mL/kg/h | INTRAVENOUS | Status: DC

## 2017-06-30 MED ORDER — ALPRAZOLAM 0.5 MG PO TABS: 0.5000 mg | ORAL_TABLET | Freq: Three times a day (TID) | ORAL | Status: DC | PRN

## 2017-06-30 MED ORDER — IOPAMIDOL (ISOVUE-370) INJECTION 76%
INTRAVENOUS | Status: AC
  Filled 2017-06-30: qty 100

## 2017-06-30 MED ORDER — PREDNISONE 5 MG PO TABS
5.0000 mg | ORAL_TABLET | Freq: Every day | ORAL | Status: DC
  Filled 2017-06-30: qty 1

## 2017-06-30 MED ORDER — CLOPIDOGREL BISULFATE 75 MG PO TABS
ORAL_TABLET | ORAL | Status: AC
  Filled 2017-06-30: qty 8

## 2017-06-30 MED ORDER — NITROGLYCERIN 1 MG/10 ML FOR IR/CATH LAB
INTRA_ARTERIAL | Status: AC
  Filled 2017-06-30: qty 10

## 2017-06-30 MED ORDER — SODIUM CHLORIDE 0.9% FLUSH: 3.0000 mL | Freq: Two times a day (BID) | INTRAVENOUS | Status: DC

## 2017-06-30 MED ORDER — SODIUM CHLORIDE 0.9 % WEIGHT BASED INFUSION
3.0000 mL/kg/h | INTRAVENOUS | Status: DC
  Administered 2017-06-30: 3 mL/kg/h via INTRAVENOUS

## 2017-06-30 MED ORDER — BOOST / RESOURCE BREEZE PO LIQD CUSTOM
1.0000 | Freq: Three times a day (TID) | ORAL | Status: DC
  Filled 2017-06-30 (×6): qty 1

## 2017-06-30 MED ORDER — HEPARIN (PORCINE) IN NACL 2-0.9 UNIT/ML-% IJ SOLN
INTRAMUSCULAR | Status: AC
  Filled 2017-06-30: qty 1000

## 2017-06-30 MED ORDER — SODIUM CHLORIDE 0.9 % IV SOLN: INTRAVENOUS | Status: AC

## 2017-06-30 MED ORDER — HEPARIN (PORCINE) IN NACL 2-0.9 UNIT/ML-% IJ SOLN
INTRAMUSCULAR | Status: AC | PRN
  Administered 2017-06-30 (×2): 500 mL

## 2017-06-30 MED ORDER — ASPIRIN 81 MG PO CHEW: 81.0000 mg | CHEWABLE_TABLET | ORAL | Status: DC

## 2017-06-30 MED ORDER — ONDANSETRON HCL 4 MG/2ML IJ SOLN: 4.0000 mg | Freq: Four times a day (QID) | INTRAMUSCULAR | Status: DC | PRN

## 2017-06-30 MED ORDER — LIDOCAINE HCL (PF) 1 % IJ SOLN
INTRAMUSCULAR | Status: DC | PRN
  Administered 2017-06-30: 2 mL

## 2017-06-30 MED ORDER — ATORVASTATIN CALCIUM 10 MG PO TABS: 10.0000 mg | ORAL_TABLET | Freq: Every day | ORAL | Status: DC

## 2017-06-30 MED ORDER — NITROGLYCERIN 1 MG/10 ML FOR IR/CATH LAB
INTRA_ARTERIAL | Status: DC | PRN
  Administered 2017-06-30: 200 ug via INTRACORONARY

## 2017-06-30 MED ORDER — ALBUTEROL SULFATE HFA 108 (90 BASE) MCG/ACT IN AERS
2.0000 | INHALATION_SPRAY | RESPIRATORY_TRACT | Status: DC | PRN
Start: 2017-06-30 — End: 2017-06-30

## 2017-06-30 MED ORDER — VERAPAMIL HCL 2.5 MG/ML IV SOLN
INTRAVENOUS | Status: DC | PRN
  Administered 2017-06-30: 10 mL via INTRA_ARTERIAL

## 2017-06-30 MED ORDER — SODIUM CHLORIDE 0.9% FLUSH
3.0000 mL | Freq: Two times a day (BID) | INTRAVENOUS | Status: DC
  Administered 2017-06-30: 22:00:00 3 mL via INTRAVENOUS

## 2017-06-30 MED ORDER — SODIUM CHLORIDE 0.9 % IV SOLN: 250.0000 mL | INTRAVENOUS | Status: DC | PRN

## 2017-06-30 MED ORDER — VERAPAMIL HCL 2.5 MG/ML IV SOLN
INTRAVENOUS | Status: AC
  Filled 2017-06-30: qty 2

## 2017-06-30 MED ORDER — TOPIRAMATE 25 MG PO TABS
100.0000 mg | ORAL_TABLET | Freq: Every day | ORAL | Status: DC
  Filled 2017-06-30: qty 4

## 2017-06-30 MED ORDER — HEPARIN SODIUM (PORCINE) 1000 UNIT/ML IJ SOLN
INTRAMUSCULAR | Status: DC | PRN
  Administered 2017-06-30: 3000 [IU] via INTRAVENOUS
  Administered 2017-06-30: 4000 [IU] via INTRAVENOUS

## 2017-06-30 MED ORDER — FENTANYL CITRATE (PF) 100 MCG/2ML IJ SOLN
INTRAMUSCULAR | Status: DC | PRN
  Administered 2017-06-30 (×2): 25 ug via INTRAVENOUS

## 2017-06-30 MED ORDER — ALBUTEROL SULFATE (2.5 MG/3ML) 0.083% IN NEBU: 2.5000 mg | INHALATION_SOLUTION | Freq: Four times a day (QID) | RESPIRATORY_TRACT | Status: DC | PRN

## 2017-06-30 MED ORDER — LABETALOL HCL 5 MG/ML IV SOLN: 10.0000 mg | INTRAVENOUS | Status: AC | PRN

## 2017-06-30 MED ORDER — ANGIOPLASTY BOOK
Freq: Once | Status: AC
  Administered 2017-06-30: 22:00:00
  Filled 2017-06-30: qty 1

## 2017-06-30 MED ORDER — MIDAZOLAM HCL 2 MG/2ML IJ SOLN
INTRAMUSCULAR | Status: DC | PRN
  Administered 2017-06-30 (×2): 1 mg via INTRAVENOUS

## 2017-06-30 MED ORDER — VORTIOXETINE HBR 10 MG PO TABS
10.0000 mg | ORAL_TABLET | Freq: Every day | ORAL | Status: DC
  Filled 2017-06-30: qty 1

## 2017-06-30 MED ORDER — ACETAMINOPHEN 325 MG PO TABS: 650.0000 mg | ORAL_TABLET | ORAL | Status: DC | PRN

## 2017-06-30 MED ORDER — CLOPIDOGREL BISULFATE 75 MG PO TABS
75.0000 mg | ORAL_TABLET | Freq: Every day | ORAL | Status: DC
  Administered 2017-07-01: 10:00:00 75 mg via ORAL
  Filled 2017-06-30: qty 1

## 2017-06-30 MED ORDER — ASPIRIN EC 81 MG PO TBEC
81.0000 mg | DELAYED_RELEASE_TABLET | Freq: Every day | ORAL | Status: DC
  Filled 2017-06-30: qty 1

## 2017-06-30 MED ORDER — IOPAMIDOL (ISOVUE-370) INJECTION 76%
INTRAVENOUS | Status: AC
  Filled 2017-06-30: qty 125

## 2017-06-30 SURGICAL SUPPLY — 21 items
BALLN EUPHORA RX 2.0X12 (BALLOONS) ×2
BALLN ~~LOC~~ EUPHORA RX 2.5X12 (BALLOONS) ×2
BALLOON EUPHORA RX 2.0X12 (BALLOONS) IMPLANT
BALLOON ~~LOC~~ EUPHORA RX 2.5X12 (BALLOONS) IMPLANT
BAND CMPR LRG ZPHR (HEMOSTASIS) ×1
BAND ZEPHYR COMPRESS 30 LONG (HEMOSTASIS) ×1 IMPLANT
CATH IMPULSE 5F ANG/FL3.5 (CATHETERS) ×1 IMPLANT
CATH VISTA GUIDE 6FR XBLAD3.5 (CATHETERS) ×1 IMPLANT
GUIDEWIRE INQWIRE 1.5J.035X260 (WIRE) IMPLANT
INQWIRE 1.5J .035X260CM (WIRE) ×2
KIT ENCORE 26 ADVANTAGE (KITS) ×1 IMPLANT
KIT HEART LEFT (KITS) ×2 IMPLANT
NDL PERC 21GX4CM (NEEDLE) IMPLANT
NEEDLE PERC 21GX4CM (NEEDLE) ×2 IMPLANT
PACK CARDIAC CATHETERIZATION (CUSTOM PROCEDURE TRAY) ×2 IMPLANT
SHEATH RAIN RADIAL 21G 6FR (SHEATH) ×1 IMPLANT
STENT RESOLUTE ONYX 2.5X18 (Permanent Stent) ×1 IMPLANT
SYR MEDRAD MARK V 150ML (SYRINGE) ×2 IMPLANT
TRANSDUCER W/STOPCOCK (MISCELLANEOUS) ×2 IMPLANT
TUBING CIL FLEX 10 FLL-RA (TUBING) ×2 IMPLANT
WIRE COUGAR XT STRL 190CM (WIRE) ×1 IMPLANT

## 2017-06-30 NOTE — Interval H&P Note (Signed)
History and Physical Interval Note:  06/30/2017 2:51 PM  Rose [name]  has presented today for cardiac cath with the diagnosis of CAD/unstable angina. The various methods of treatment have been discussed with the patient and family. After consideration of risks, benefits and other options for treatment, the patient has consented to  Procedure(s): LEFT HEART CATH AND CORONARY ANGIOGRAPHY (N/A) as a surgical intervention .  The patient's history has been reviewed, patient examined, no change in status, stable for surgery.  I have reviewed the patient's chart and labs.  Questions were answered to the patient's satisfaction.    Cath Lab Visit (complete for each Cath Lab visit)  Clinical Evaluation Leading to the Procedure:   ACS: No.  Non-ACS:    Anginal Classification: CCS III  Anti-ischemic medical therapy: No Therapy  Non-Invasive Test Results: No non-invasive testing performed  Prior CABG: No previous CABG         Lauree Chandler

## 2017-06-30 NOTE — Research (Signed)
CADFEM Informed Consent   Subject Name: Rose [name]  Subject met inclusion and exclusion criteria.  The informed consent form, study requirements and expectations were reviewed with the subject and questions and concerns were addressed prior to the signing of the consent form.  The subject verbalized understanding of the trail requirements.  The subject agreed to participate in the CADFEM trial and signed the informed consent.  The informed consent was obtained prior to performance of any protocol-specific procedures for the subject.  A copy of the signed informed consent was given to the subject and a copy was placed in the subject's medical record.  Neva Seat 06/30/2017, 12:55 PM

## 2017-07-01 ENCOUNTER — Encounter (HOSPITAL_COMMUNITY): Payer: Self-pay | Admitting: Cardiovascular Disease

## 2017-07-01 DIAGNOSIS — F17200 Nicotine dependence, unspecified, uncomplicated: Secondary | ICD-10-CM

## 2017-07-01 DIAGNOSIS — I10 Essential (primary) hypertension: Secondary | ICD-10-CM | POA: Diagnosis not present

## 2017-07-01 DIAGNOSIS — Z8572 Personal history of non-Hodgkin lymphomas: Secondary | ICD-10-CM | POA: Diagnosis not present

## 2017-07-01 DIAGNOSIS — I25118 Atherosclerotic heart disease of native coronary artery with other forms of angina pectoris: Secondary | ICD-10-CM | POA: Diagnosis not present

## 2017-07-01 DIAGNOSIS — I25110 Atherosclerotic heart disease of native coronary artery with unstable angina pectoris: Secondary | ICD-10-CM | POA: Diagnosis not present

## 2017-07-01 DIAGNOSIS — E785 Hyperlipidemia, unspecified: Secondary | ICD-10-CM | POA: Diagnosis not present

## 2017-07-01 LAB — BASIC METABOLIC PANEL
ANION GAP: 9 (ref 5–15)
BUN: 19 mg/dL (ref 6–20)
CALCIUM: 8.6 mg/dL — AB (ref 8.9–10.3)
CHLORIDE: 109 mmol/L (ref 101–111)
CO2: 21 mmol/L — ABNORMAL LOW (ref 22–32)
CREATININE: 0.8 mg/dL (ref 0.44–1.00)
GFR calc non Af Amer: >60 mL/min (ref >60)
Glucose, Bld: 75 mg/dL (ref 65–99)
Potassium: 4.2 mmol/L (ref 3.5–5.1)
SODIUM: 139 mmol/L (ref 135–145)

## 2017-07-01 LAB — CBC
HCT: 37.1 % (ref 36.0–46.0)
HEMOGLOBIN: 12.3 g/dL (ref 12.0–15.0)
MCH: 33 pg (ref 26.0–34.0)
MCHC: 33.2 g/dL (ref 30.0–36.0)
MCV: 99.5 fL (ref 78.0–100.0)
PLATELETS: 165 10*3/uL (ref 150–400)
RBC: 3.73 MIL/uL — AB (ref 3.87–5.11)
RDW: 12.9 % (ref 11.5–15.5)
WBC: 4.6 10*3/uL (ref 4.0–10.5)

## 2017-07-01 MED ORDER — CLOPIDOGREL BISULFATE 75 MG PO TABS: 75.0000 mg | ORAL_TABLET | Freq: Every day | ORAL | 2 refills | Status: DC

## 2017-07-01 MED ORDER — NITROGLYCERIN 0.4 MG SL SUBL: 0.4000 mg | SUBLINGUAL_TABLET | SUBLINGUAL | 2 refills | Status: DC | PRN

## 2017-07-01 MED ORDER — ATORVASTATIN CALCIUM 40 MG PO TABS: 40.0000 mg | ORAL_TABLET | Freq: Every day | ORAL | 0 refills | Status: AC

## 2017-07-01 MED FILL — Clopidogrel Bisulfate Tab 75 MG (Base Equiv): ORAL | Qty: 8 | Status: AC

## 2017-07-01 MED FILL — Heparin Sodium (Porcine) 2 Unit/ML in Sodium Chloride 0.9%: INTRAMUSCULAR | Qty: 1000 | Status: AC

## 2017-07-01 NOTE — Progress Notes (Signed)
CARDIAC REHAB PHASE I   PRE:  Rate/Rhythm: 68 SR  BP:  Supine: 118/59  Sitting:   Standing:    SaO2:   MODE:  Ambulation: 800 ft   POST:  Rate/Rhythm: 84 SR  BP:  Supine:   Sitting: 155/78  Standing:    SaO2: 100%RA 0820-0911 Pt walked 800 ft on RA with steady gait and no CP. Tolerated well. Education completed with pt and husband who voiced understanding. Stressed importance of plavix with stent. Reviewed NTG use, ex ed, tobacco cessation, and heart healthy food choices. Gave fake cigarette and smoking cessation handout. Husband trying to quit also. Pt has quit once when pregnant. Discussed CRP 2 and will refer to [place]. Pt may not be able to attend as she travels with husband for his work.   Graylon Good, RN BSN  07/01/2017 9:09 AM

## 2017-07-01 NOTE — Progress Notes (Signed)
[facility] BAND REMOVAL  LOCATION:    right radial  DEFLATED PER PROTOCOL:    Yes.    TIME BAND OFF / DRESSING APPLIED:    0100   SITE UPON ARRIVAL:    Level 0  SITE AFTER BAND REMOVAL:    Level 0  CIRCULATION SENSATION AND MOVEMENT:    Within Normal Limits   Yes.    COMMENTS:   Pt.tolerated the procedure well

## 2017-07-01 NOTE — Discharge Summary (Addendum)
The patient has been seen in conjunction with Reino Bellis, NP-C. All aspects of care have been considered and discussed. The patient has been personally interviewed, examined, and all clinical data has been reviewed.   Stable post stent of proximal LAD.  Coronary calcification noted on CT and developed exertional angina subsequently.  Aspirin and Plavix combination for at least 6 months and preferably 1 year.  Risk factor modification stressed and will include high intensity Lipitor therapy.  LDL target less than 70.  Liver enzyme monitoring in 6-8 weeks.  Access site, right radial is unremarkable.  Eligible for discharge today.  Discharge Summary    Patient ID: Rose [name],  MRN: [id number], DOB/AGE: 07/04/1965 52 y.o.  Admit date: 06/30/2017 Discharge date: 07/01/2017  Primary Care Provider: Cher Nakai Primary Cardiologist: Dr. Bettina Gavia  Discharge Diagnoses    Active Problems:   Unstable angina [facility])   Hyperlipidemia with target low density lipoprotein (LDL) cholesterol less than 160 mg/dL   SMOKER   Allergies Allergies  Allergen Reactions  . Naproxen Anaphylaxis and Other (See Comments)    Throat closes can't breathe  . Niacin Anaphylaxis    Diagnostic Studies/Procedures    Cath: 06/30/17  Conclusion     Mid RCA lesion is 60% stenosed.  Prox Cx lesion is 20% stenosed.  Prox LAD-1 lesion is 80% stenosed.  Prox LAD-2 lesion is 50% stenosed.  A drug-eluting stent was successfully placed using a STENT RESOLUTE ONYX 2.5X18.  Post intervention, there is a 0% residual stenosis.  Post intervention, there is a 0% residual stenosis.  The left ventricular systolic function is normal.  LV end diastolic pressure is normal.  The left ventricular ejection fraction is 55-65% by visual estimate.  There is no mitral valve regurgitation.   1. Severe stenosis proximal LAD.  2. Successful PTCA/DES x 1 proximal LAD 3. Mild non-obstructive disease in the  proximal Circumflex 4. Moderate stenosis in the small caliber, mid RCA 5. Normal LV function  Recommendations: Continue DAPT with ASA and Plavix for one year. Continue statin. Consider addition of beta blocker.   _____________   History of Present Illness     52 y.o. female with NHL who was seen in the office on 3/25 by Dr. Bettina Gavia for the evaluation of chest pain and CAC seen on recent CT chest and abdomen at the request of [place], Sean, DO. She has a history of a non-Hodgkin's lymphoma and has had disease progression and may require additional treatment.  She has had previous XRT.  She is lost 30 pounds since November and a total of 60 pounds since her diagnosis.  She has emphysema and is chronic exertional shortness of breath and a chronic cough.  She was noted on CT scan to have coronary artery calcification.  Recently she has developed a pattern of exertional chest discomfort substernal it occurs when she be walking quickly doing housework it was severe limiting causes her to rest and has relief in 5-10 minutes.  The symptoms have progressed and now she has frequent episodes at rest relieved with rest.  So far it has not awakened her from her sleep.  She underwent a stress test 10 years ago pharmacologically that was unremarkable.  She also has sleep apnea sleeps with CPAP but has to sleep on 2 pillows and has no history of heart failure or edema.  She has no background history of heart disease and has had no palpitation or syncope. Because of typical symptoms of  angina coronary artery calcification and rapid progression symptoms consistent with unstable angina she was advised her to undergo coronary angiography.    Hospital Course     Underwent cardiac cath noted above with successful PTCA/DES x1 to the pLAD, with mild nonobstructive disease in the pLCx with normal LV function. Plan for DAPT with ASA/plavix for at least one year. Morning labs were stable. No recurrent chest pain. Worked well  with cardiac rehab. Considered adding BB, but low resting HR and Hx of emphysema. Did increase her Lipitor to 40mg  daily.    General: Well developed, well nourished, female appearing in no acute distress. Head: Normocephalic, atraumatic.  Neck: Supple without bruits, JVD. Lungs:  Resp regular and unlabored, CTA. Heart: RRR, S1, S2, no S3, S4, or murmur; no rub. Abdomen: Soft, non-tender, non-distended with normoactive bowel sounds. No hepatomegaly. No rebound/guarding. No obvious abdominal masses. Extremities: No clubbing, cyanosis, edema. Distal pedal pulses are 2+ bilaterally. R radial cath site stable without bruising or hematoma Neuro: Alert and oriented X 3. Moves all extremities spontaneously. Psych: Normal affect.  CASHLYN HUGULEY was seen by Dr. Tamala Julian and determined stable for discharge home. Follow up in the office has been arranged. Medications are listed below.   _____________  Discharge Vitals Blood pressure (!) 118/59, pulse 66, temperature 98.3 F (36.8 C), temperature source Oral, resp. rate 18, height 5\' 5"  (1.651 m), weight 101 lb 6.6 oz (46 kg), SpO2 99 %.  Filed Weights   06/30/17 1219 07/01/17 0633  Weight: 102 lb (46.3 kg) 101 lb 6.6 oz (46 kg)    Labs & Radiologic Studies    CBC Recent Labs    06/28/17 1647 07/01/17 0244  WBC 5.5 4.6  HGB 13.6 12.3  HCT 39.8 37.1  MCV 98* 99.5  PLT 212 353   Basic Metabolic Panel Recent Labs    06/28/17 1647 07/01/17 0244  NA 143 139  K 5.1 4.2  CL 106 109  CO2 22 21*  GLUCOSE 96 75  BUN 19 19  CREATININE 0.91 0.80  CALCIUM 9.5 8.6*   Liver Function Tests No results for input(s): AST, ALT, ALKPHOS, BILITOT, PROT, ALBUMIN in the last 72 hours. No results for input(s): LIPASE, AMYLASE in the last 72 hours. Cardiac Enzymes No results for input(s): CKTOTAL, CKMB, CKMBINDEX, TROPONINI in the last 72 hours. BNP Invalid input(s): POCBNP D-Dimer No results for input(s): DDIMER in the last 72  hours. Hemoglobin A1C No results for input(s): HGBA1C in the last 72 hours. Fasting Lipid Panel No results for input(s): CHOL, HDL, LDLCALC, TRIG, CHOLHDL, LDLDIRECT in the last 72 hours. Thyroid Function Tests No results for input(s): TSH, T4TOTAL, T3FREE, THYROIDAB in the last 72 hours.  Invalid input(s): FREET3 _____________  Dg Chest 2 View  Result Date: 06/29/2017 CLINICAL DATA:  Central chest pain, preop for heart catheterization EXAM: CHEST - 2 VIEW COMPARISON:  06/07/2013 FINDINGS: Hyperinflation/COPD. Heart and mediastinal contours are within normal limits. No focal opacities or effusions. No acute bony abnormality. IMPRESSION: COPD.  No active disease. Electronically Signed   By: Rolm Baptise M.D.   On: 06/29/2017 08:05   Disposition   Pt is being discharged home today in good condition.  Follow-up Plans & Appointments    Follow-up Information    Richardo Priest, MD Follow up on 07/12/2017.   Specialty:  Cardiology Why:  at 10am for your follow up appt.  Contact information: [place] [address] [phone]  Discharge Instructions    Call MD for:  redness, tenderness, or signs of infection (pain, swelling, redness, odor or green/yellow discharge around incision site)   Complete by:  As directed    Diet - low sodium heart healthy   Complete by:  As directed    Discharge instructions   Complete by:  As directed    Radial Site Care Refer to this sheet in the next few weeks. These instructions provide you with information on caring for yourself after your procedure. Your caregiver may also give you more specific instructions. Your treatment has been planned according to current medical practices, but problems sometimes occur. Call your caregiver if you have any problems or questions after your procedure. HOME CARE INSTRUCTIONS You may shower the day after the procedure.Remove the bandage (dressing) and gently wash the site with  plain soap and water.Gently pat the site dry.  Do not apply powder or lotion to the site.  Do not submerge the affected site in water for 3 to 5 days.  Inspect the site at least twice daily.  Do not flex or bend the affected arm for 24 hours.  No lifting over 5 pounds (2.3 kg) for 5 days after your procedure.  Do not drive home if you are discharged the same day of the procedure. Have someone else drive you.  You may drive 24 hours after the procedure unless otherwise instructed by your caregiver.  What to expect: Any bruising will usually fade within 1 to 2 weeks.  Blood that collects in the tissue (hematoma) may be painful to the touch. It should usually decrease in size and tenderness within 1 to 2 weeks.  SEEK IMMEDIATE MEDICAL CARE IF: You have unusual pain at the radial site.  You have redness, warmth, swelling, or pain at the radial site.  You have drainage (other than a small amount of blood on the dressing).  You have chills.  You have a fever or persistent symptoms for more than 72 hours.  You have a fever and your symptoms suddenly get worse.  Your arm becomes pale, cool, tingly, or numb.  You have heavy bleeding from the site. Hold pressure on the site.   PLEASE DO NOT MISS ANY DOSES OF YOUR PLAVIX!!!!! Also keep a log of you blood pressures and bring back to your follow up appt. Please call the office with any questions.   Patients taking blood thinners should generally stay away from medicines like ibuprofen, Advil, Motrin, naproxen, and Aleve due to risk of stomach bleeding. You may take Tylenol as directed or talk to your primary doctor about alternatives.   Increase activity slowly   Complete by:  As directed       Discharge Medications     Medication List    TAKE these medications   albuterol (2.5 MG/3ML) 0.083% nebulizer solution Commonly known as:  PROVENTIL Take 3 mLs (2.5 mg total) by nebulization every 6 (six) hours as needed for wheezing. What changed:   Another medication with the same name was changed. Make sure you understand how and when to take each.   albuterol 108 (90 Base) MCG/ACT inhaler Commonly known as:  PROVENTIL HFA;VENTOLIN HFA Inhale two puffs every 4-6 hours only as needed for shortness of breath or wheezing. What changed:    how much to take  how to take this  when to take this  reasons to take this  additional instructions   ALPRAZolam 0.25 MG tablet Commonly known as:  XANAX TAKE 1 TABLET BY MOUTH TWICE DAILY AS NEEDED FOR SLEEP What changed:  See the new instructions.   AMBULATORY NON FORMULARY MEDICATION Cpap titration and mask fitting Dx sleep apnea   aspirin EC 81 MG tablet Take 81 mg by mouth daily.   atorvastatin 40 MG tablet Commonly known as:  LIPITOR Take 1 tablet (40 mg total) by mouth daily at 6 PM. What changed:    medication strength  See the new instructions.   buPROPion 150 MG 12 hr tablet Commonly known as:  WELLBUTRIN SR Take 150 mg by mouth once daily   clopidogrel 75 MG tablet Commonly known as:  PLAVIX Take 1 tablet (75 mg total) by mouth daily with breakfast.   nitroGLYCERIN 0.4 MG SL tablet Commonly known as:  NITROSTAT Place 1 tablet (0.4 mg total) under the tongue every 5 (five) minutes as needed.   predniSONE 5 MG tablet Commonly known as:  DELTASONE Take 5 mg by mouth daily.   REFRESH OPTIVE OP Place 2 drops into both eyes daily.   topiramate 100 MG tablet Commonly known as:  TOPAMAX TAKE 1 TABLET BY MOUTH TWICE DAILY What changed:    how much to take  how to take this  when to take this  additional instructions   TRINTELLIX 10 MG Tabs tablet Generic drug:  vortioxetine HBr Take 10 mg by mouth daily        Aspirin prescribed at discharge?  Yes High Intensity Statin Prescribed? (Lipitor 40-80mg  or Crestor 20-40mg ): Yes Beta Blocker Prescribed? No: Bradycardia For EF <40%, was ACEI/ARB Prescribed? No: EF ok ADP Receptor Inhibitor Prescribed?  (i.e. Plavix etc.-Includes Medically Managed Patients): Yes For EF <40%, Aldosterone Inhibitor Prescribed? No: EF ok Was EF assessed during THIS hospitalization? Yes Was Cardiac Rehab II ordered? (Included Medically managed Patients): Yes   Outstanding Labs/Studies   FLP/LFTs in 6 weeks if tolerating statin increase.   Duration of Discharge Encounter   Greater than 30 minutes including physician time.  Signed, Reino Bellis NP-C 07/01/2017, 8:42 AM

## 2017-07-11 DIAGNOSIS — I25119 Atherosclerotic heart disease of native coronary artery with unspecified angina pectoris: Secondary | ICD-10-CM | POA: Insufficient documentation

## 2017-07-11 NOTE — Progress Notes (Signed)
Cardiology Office Note:    Date:  07/13/2017   ID:  Rose [name], DOB Jan 07, 1966, MRN [id number]  PCP:  Cher Nakai, MD  Cardiologist:  Shirlee More, MD    Referring MD: Cher Nakai, MD    ASSESSMENT:    1. Coronary artery disease involving native coronary artery of native heart with angina pectoris ([place])   2. Hypertension, benign   3. Non-Hodgkin lymphoma of intra-abdominal lymph nodes, unspecified non-Hodgkin lymphoma type ([place])   4. Pure hypercholesterolemia    PLAN:    In order of problems listed above:  1. Improved after recent PCI for severe obstructive LAD disease in the setting of multivessel CAD she did not of acute coronary syndrome she will stay on dual antiplatelet therapy for 6 months and if she has trouble tolerating aspirin with underlying lymphoma we could do single drug therapy after 1 month.  She will continue her other medical treatment and follow-up in the office in 3 months 2. Stable continue current treatment 3. Awaiting decision regarding additional therapy for progression of her lymphoma 4. Stable continue her statin will check a lipid profile at her next office visit   Next appointment: 3 months   Medication Adjustments/Labs and Tests Ordered: Current medicines are reviewed at length with the patient today.  Concerns regarding medicines are outlined above.  No orders of the defined types were placed in this encounter.  No orders of the defined types were placed in this encounter.   Chief Complaint  Patient presents with  . Follow-up    2 week follow up appt after LHC    History of Present Illness:    Rose [name] is a 52 y.o. female with a hx of  a history of a non-Hodgkin's lymphoma and has had disease progression and may require additional treatment.  She has had previous XRT.  She is lost 30 pounds since November and a total of 60 pounds since her diagnosis.  She has emphysema and is chronic exertional shortness of breath and a chronic  cough.  She was noted on CT scan to have coronary artery calcification.  Recently she has developed a pattern of exertional chest discomfort substernal it occurs when she be walking quickly doing housework it was severe limiting causes her to rest and has relief in 5-10 minutes.  The symptoms have progressed and now she has frequent episodes at rest relieved with rest. She was  last seen 06/28/17 and referred for left heart cath.  Diagnostic Studies/Procedures    Cath: 06/30/17  Conclusion    Mid RCA lesion is 60% stenosed.  Prox Cx lesion is 20% stenosed.  Prox LAD-1 lesion is 80% stenosed.  Prox LAD-2 lesion is 50% stenosed.  A drug-eluting stent was successfully placed using a STENT RESOLUTE ONYX 2.5X18.  Post intervention, there is a 0% residual stenosis.  Post intervention, there is a 0% residual stenosis.  The left ventricular systolic function is normal.  LV end diastolic pressure is normal.  The left ventricular ejection fraction is 55-65% by visual estimate.  There is no mitral valve regurgitation.  1. Severe stenosis proximal LAD.  2. Successful PTCA/DES x 1 proximal LAD 3. Mild non-obstructive disease in the proximal Circumflex 4. Moderate stenosis in the small caliber, mid RCA 5. Normal LV function   Compliance with diet, lifestyle and medications: Yes She feels much improved no longer having shortness of breath or exertional chest pain and tolerates aspirin and clopidogrel.  She does not have any  plans for treatment of her lymphoma at this time.  No palpitations syncope or TIA Past Medical History:  Diagnosis Date  . Adult BMI <19 kg/sq m 06/22/2017  . ALLERGIC RHINITIS 11/05/2009   Qualifier: Diagnosis of  By: [name]    . Anxiety 06/22/2017  . Arteritis ([place]) 06/22/2017  . Chronic bronchitis ([place])   . Chronic idiopathic constipation 09/05/2012  . Chronic nausea 06/22/2017  . CHRONIC OBSTRUCTIVE PULMONARY DISEASE, ACUTE EXACERBATION 10/17/2009    Qualifier: Diagnosis of  By: [name]    . COMMON MIGRAINE 08/17/2008   Qualifier: Diagnosis of  By: [name]    . COPD 05/24/2007   Qualifier: Diagnosis of  By: [name]    . COPD GOLD II 05/24/2007   Refuses any inhalers with black box warning of sudden cardiac death - spirometry 07/22/2013 FEV1  2.07 ( 74%) ratio 69 so GOLD II barely but still smoking     . Coronary artery disease   . Cough 06/10/2013   Followed in Pulmonary clinic/ [facility]/ [facility]    . DEPRESSION 05/24/2007   Qualifier: Diagnosis of  By: [name]    . Depression 05/24/2007  . Dysuria 01/06/2010   Qualifier: Diagnosis of  By: [name]    . FATIGUE 01/06/2010   Qualifier: Diagnosis of  By: [name]    . GERD without esophagitis 06/22/2017  . Hemoptysis 06/10/2013   Followed in Pulmonary clinic/ [facility]/ [facility]    . History of hiatal hernia   . History of kidney stones   . Hyperlipidemia LDL goal < 160 Jul 23, 2007   Framingham Risk 3% Dec 2013    . Hypertension, benign 04/18/2007  . Impaired fasting glucose 09/17/2009   Qualifier: Diagnosis of  By: Birdie Riddle MD, Belenda Cruise    . Migraine    "take RX qd; still have ~ 1/wk" (06/30/2017)  . Non-Hodgkin lymphoma ([place]) dx'd 03/2014  . OSA on CPAP   . OSA on CPAP 02/26/2014   CPAP titration to 13 CWP, AHI 0 per hour. She wore an extra small ResMed AirFit F-10 fullface mask with heated humidifier   . Other insomnia 06/22/2017  . PANIC DISORDER 04/18/2007   Qualifier: Diagnosis of  By: Ronnald Ramp MD, Lauren    . Polymyalgia rheumatica ([place]) 12/02/2012   Taper begun November 2014,  7.5 --> 5mg  August 2015   . Shingles (herpes zoster) polyneuropathy 06/22/2017  . Sleep apnea 05/24/2007   Qualifier: Diagnosis of  By: [name]    . SMOKER 04/18/2007   Qualifier: Diagnosis of  By: Ronnald Ramp MD, Lauren    . THYROMEGALY 08/17/2007   Qualifier: Diagnosis of  By: [name]      Past Surgical History:  Procedure Laterality Date  .  APPENDECTOMY  03/2017  . AUGMENTATION MAMMAPLASTY    . CORONARY ANGIOPLASTY WITH STENT PLACEMENT  06/30/2017  . CORONARY STENT INTERVENTION N/A 06/30/2017   Procedure: CORONARY STENT INTERVENTION;  Surgeon: Burnell Blanks, MD;  Location: [facility];  Service: Cardiovascular;  Laterality: N/A;  . DILATION AND CURETTAGE OF UTERUS    . ELBOW FRACTURE SURGERY Left   . FRACTURE SURGERY    . INCONTINENCE SURGERY  2004  . LEFT HEART CATH AND CORONARY ANGIOGRAPHY N/A 06/30/2017   Procedure: LEFT HEART CATH AND CORONARY ANGIOGRAPHY;  Surgeon: Burnell Blanks, MD;  Location: [facility];  Service: Cardiovascular;  Laterality: N/A;  . PLACEMENT OF  BREAST IMPLANTS  1998  . TUBAL LIGATION  1988  . VAGINAL HYSTERECTOMY  2004    Current Medications: Current Meds  Medication Sig  . albuterol (PROVENTIL HFA;VENTOLIN HFA) 108 (90 BASE) MCG/ACT inhaler Inhale two puffs every 4-6 hours only as needed for shortness of breath or wheezing. (Patient taking differently: Inhale 2 puffs into the lungs every 4 (four) hours as needed for wheezing or shortness of breath. )  . albuterol (PROVENTIL) (2.5 MG/3ML) 0.083% nebulizer solution Take 3 mLs (2.5 mg total) by nebulization every 6 (six) hours as needed for wheezing.  Marland Kitchen ALPRAZolam (XANAX) 0.25 MG tablet TAKE 1 TABLET BY MOUTH TWICE DAILY AS NEEDED FOR SLEEP (Patient taking differently: Take 0.5 mg by mouth 4 times daily as needed for anxiety)  . AMBULATORY NON FORMULARY MEDICATION Cpap titration and mask fitting Dx sleep apnea  . aspirin EC 81 MG tablet Take 81 mg by mouth daily.  Marland Kitchen atorvastatin (LIPITOR) 40 MG tablet Take 1 tablet (40 mg total) by mouth daily at 6 PM.  . buPROPion (WELLBUTRIN SR) 150 MG 12 hr tablet Take 150 mg by mouth once daily  . Carboxymethylcellul-Glycerin (REFRESH OPTIVE OP) Place 2 drops into both eyes daily.  . clopidogrel (PLAVIX) 75 MG tablet Take 1 tablet (75 mg total) by mouth daily with breakfast.  .  nitroGLYCERIN (NITROSTAT) 0.4 MG SL tablet Place 1 tablet (0.4 mg total) under the tongue every 5 (five) minutes as needed.  . predniSONE (DELTASONE) 5 MG tablet Take 5 mg by mouth daily.   . TRINTELLIX 10 MG TABS tablet Take 10 mg by mouth daily     Allergies:   Naproxen and Niacin   Social History   Socioeconomic History  . Marital status: Married    Spouse name: Not on file  . Number of children: Not on file  . Years of education: Not on file  . Highest education level: Not on file  Occupational History  . Occupation: Agricultural consultant: [employer]  Social Needs  . Financial resource strain: Not on file  . Food insecurity:    Worry: Not on file    Inability: Not on file  . Transportation needs:    Medical: Not on file    Non-medical: Not on file  Tobacco Use  . Smoking status: Current Every Day Smoker    Packs/day: 0.50    Years: 41.00    Pack years: 20.50    Types: Cigarettes  . Smokeless tobacco: Never Used  . Tobacco comment: using vape   Substance and Sexual Activity  . Alcohol use: Not Currently    Frequency: Never  . Drug use: Never  . Sexual activity: Not on file  Lifestyle  . Physical activity:    Days per week: Not on file    Minutes per session: Not on file  . Stress: Not on file  Relationships  . Social connections:    Talks on phone: Not on file    Gets together: Not on file    Attends religious service: Not on file    Active member of club or organization: Not on file    Attends meetings of clubs or organizations: Not on file    Relationship status: Not on file  Other Topics Concern  . Not on file  Social History Narrative  . Not on file     Family History: The patient's family history includes Anemia in her mother; Breast cancer in her mother; Heart attack in  her brother, father, maternal grandfather, paternal grandfather, and sister; Heart disease in her brother, father, maternal grandfather, paternal grandfather, and sister;  Hyperlipidemia in her brother, father, maternal grandfather, maternal grandmother, mother, paternal grandfather, paternal grandmother, and sister; Hypertension in her brother, father, maternal grandfather, maternal grandmother, mother, paternal grandfather, paternal grandmother, and sister; Prostate cancer in her brother and father; Skin cancer in her father; Stroke in her maternal grandfather. ROS:   Please see the history of present illness.    All other systems reviewed and are negative.  EKGs/Labs/Other Studies Reviewed:    The following studies were reviewed today:   Recent Labs: 07/01/2017: BUN 19; Creatinine, Ser 0.80; Hemoglobin 12.3; Platelets 165; Potassium 4.2; Sodium 139  Recent Lipid Panel    Component Value Date/Time   CHOL 203 (H) 03/22/2012 1038   TRIG 162 (H) 03/22/2012 1038   HDL 57 03/22/2012 1038   CHOLHDL 3.6 03/22/2012 1038   VLDL 32 03/22/2012 1038   LDLCALC 114 (H) 03/22/2012 1038    Physical Exam:    VS:  BP 118/76 (BP Location: Right Arm, Patient Position: Sitting, Cuff Size: Normal)   Pulse 84   Ht 5\' 5"  (1.651 m)   Wt 101 lb 12.8 oz (46.2 kg)   SpO2 99%   BMI 16.94 kg/m     Wt Readings from Last 3 Encounters:  07/12/17 101 lb 12.8 oz (46.2 kg)  07/01/17 101 lb 6.6 oz (46 kg)  06/28/17 101 lb 1.9 oz (45.9 kg)     GEN:  Well nourished, well developed in no acute distress HEENT: Normal NECK: No JVD; No carotid bruits LYMPHATICS: No lymphadenopathy CARDIAC: RRR, no murmurs, rubs, gallops RESPIRATORY:  Clear to auscultation without rales, wheezing or rhonchi  ABDOMEN: Soft, non-tender, non-distended MUSCULOSKELETAL:  No edema; No deformity  SKIN: Warm and dry NEUROLOGIC:  Alert and oriented x 3 PSYCHIATRIC:  Normal affect    Signed, Shirlee More, MD  07/13/2017 7:44 AM    [place]

## 2017-07-12 ENCOUNTER — Ambulatory Visit: Payer: BLUE CROSS/BLUE SHIELD | Admitting: Cardiology

## 2017-07-12 ENCOUNTER — Encounter: Payer: Self-pay | Admitting: Cardiology

## 2017-07-12 VITALS — BP 118/76 | HR 84 | Ht 65.0 in | Wt 101.8 lb

## 2017-07-12 DIAGNOSIS — C8593 Non-Hodgkin lymphoma, unspecified, intra-abdominal lymph nodes: Secondary | ICD-10-CM

## 2017-07-12 DIAGNOSIS — E7800 Pure hypercholesterolemia, unspecified: Secondary | ICD-10-CM

## 2017-07-12 DIAGNOSIS — I10 Essential (primary) hypertension: Secondary | ICD-10-CM

## 2017-07-12 DIAGNOSIS — I25119 Atherosclerotic heart disease of native coronary artery with unspecified angina pectoris: Secondary | ICD-10-CM | POA: Diagnosis not present

## 2017-07-12 NOTE — Patient Instructions (Signed)

## 2017-12-31 ENCOUNTER — Ambulatory Visit: Payer: BLUE CROSS/BLUE SHIELD | Admitting: Cardiology

## 2017-12-31 VITALS — BP 132/80 | HR 69 | Ht 65.0 in | Wt 115.8 lb

## 2017-12-31 DIAGNOSIS — C8593 Non-Hodgkin lymphoma, unspecified, intra-abdominal lymph nodes: Secondary | ICD-10-CM | POA: Diagnosis not present

## 2017-12-31 DIAGNOSIS — I10 Essential (primary) hypertension: Secondary | ICD-10-CM | POA: Diagnosis not present

## 2017-12-31 DIAGNOSIS — E7800 Pure hypercholesterolemia, unspecified: Secondary | ICD-10-CM

## 2017-12-31 DIAGNOSIS — I25119 Atherosclerotic heart disease of native coronary artery with unspecified angina pectoris: Secondary | ICD-10-CM | POA: Diagnosis not present

## 2017-12-31 NOTE — Progress Notes (Signed)
Cardiology Office Note:    Date:  12/31/2017   ID:  Rose [name], DOB [birth year], MRN [id number]  PCP:  Rose Nakai, [name]  Cardiologist:  Rose More, [name]    Referring [name]: Rose Nakai, [name]    ASSESSMENT:    1. Coronary artery disease involving native coronary artery of native heart with angina pectoris ([place])   2. Hypertension, benign   3. Pure hypercholesterolemia   4. Non-Hodgkin lymphoma of intra-abdominal lymph nodes, unspecified non-Hodgkin lymphoma type ([place])    PLAN:    In order of problems listed above:  1. Her CAD is stable presently New [place] Heart Association class I having no anginal discomfort.  She is having problems with bruising I asked her to try to stay on aspirin and clopidogrel but as of 01/11/2018 she could transition to single agent clopidogrel if necessary with excessive bruising. 2. Hypertension stable blood pressures at target  3. Hyperlipidemia stable lipids are ideal continue a high intensity statin recent cholesterol 183 HDL 96 LDL 66 4. Unfortunately her lymphoma is progressed likely looking at chemotherapy and I think her heart disease is stable and should not present a barrier to care.   Next appointment: 6 months   Medication Adjustments/Labs and Tests Ordered: Current medicines are reviewed at length with the patient today.  Concerns regarding medicines are outlined above.  No orders of the defined types were placed in this encounter.  No orders of the defined types were placed in this encounter.   Chief Complaint  Patient presents with  . Coronary Artery Disease    History of Present Illness:    Rose [name] is a 52 y.o. female with a hx of CAD with PCI and stent proximal LAD 06/30/2017.  Other medical problems include non-Hodgkin's lymphoma hypertension she was last seen 07/12/2017. Compliance with diet, lifestyle and medications: yes Past Medical History:  Diagnosis Date  . Adult BMI <19 kg/sq m 06/22/2017  . ALLERGIC RHINITIS  11/05/2009   Qualifier: Diagnosis of  By: Rose [name]    . Anxiety 06/22/2017  . Arteritis ([place]) 06/22/2017  . Chronic bronchitis ([place])   . Chronic idiopathic constipation 09/05/2012  . Chronic nausea 06/22/2017  . CHRONIC OBSTRUCTIVE PULMONARY DISEASE, ACUTE EXACERBATION 10/17/2009   Qualifier: Diagnosis of  By: Rose [name]    . COMMON MIGRAINE 08/17/2008   Qualifier: Diagnosis of  By: Rose [name]    . COPD 05/24/2007   Qualifier: Diagnosis of  By: Rose [name]    . COPD GOLD II 05/24/2007   Refuses any inhalers with black box warning of sudden cardiac death - spirometry 07-22-2013 FEV1  2.07 ( 74%) ratio 69 so GOLD II barely but still smoking     . Coronary artery disease   . Cough 06/10/2013   Followed in Pulmonary clinic/ [facility]/ [facility]    . DEPRESSION 05/24/2007   Qualifier: Diagnosis of  By: Rose [name]    . Depression 05/24/2007  . Dysuria 01/06/2010   Qualifier: Diagnosis of  By: Rose [name]    . FATIGUE 01/06/2010   Qualifier: Diagnosis of  By: Rose [name]    . GERD without esophagitis 06/22/2017  . Hemoptysis 06/10/2013   Followed in Pulmonary clinic/ [facility]/ [facility]    . History of hiatal hernia   . History of kidney stones   . Hyperlipidemia LDL goal < 160 07/23/2007   Framingham Risk 3% Dec 2013    . Hypertension, benign 04/18/2007  .  Impaired fasting glucose 09/17/2009   Qualifier: Diagnosis of  By: Rose Riddle [name], Rose [name]    . Migraine    "take RX qd; still have ~ 1/wk" (06/30/2017)  . Non-Hodgkin lymphoma ([place]) dx'd 03/2014  . OSA on CPAP   . OSA on CPAP 02/26/2014   CPAP titration to 13 CWP, AHI 0 per hour. She wore an extra small ResMed AirFit F-10 fullface mask with heated humidifier   . Other insomnia 06/22/2017  . PANIC DISORDER 04/18/2007   Qualifier: Diagnosis of  By: Rose [name], Rose [name]    . Polymyalgia rheumatica (Rose [name]) 12/02/2012   Taper begun November 2014,  7.5 --> 5mg  August 2015   . Shingles (herpes zoster) polyneuropathy  06/22/2017  . Sleep apnea 05/24/2007   Qualifier: Diagnosis of  By: Rose [name]    . SMOKER 04/18/2007   Qualifier: Diagnosis of  By: Rose [name], Rose [name]    . 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 APPENDECTOMY  03/2017  . AUGMENTATION MAMMAPLASTY    . CORONARY ANGIOPLASTY WITH STENT PLACEMENT  06/30/2017  . CORONARY STENT INTERVENTION N/A 06/30/2017   Procedure: CORONARY STENT INTERVENTION;  Surgeon: Rose Blanks, [name];  Location: [facility];  Service: Cardiovascular;  Laterality: N/A;  . DILATION AND CURETTAGE OF UTERUS    . ELBOW FRACTURE SURGERY Left   . FRACTURE SURGERY    . INCONTINENCE SURGERY  2004  . LEFT HEART CATH AND CORONARY ANGIOGRAPHY N/A 06/30/2017   Procedure: LEFT HEART CATH AND CORONARY ANGIOGRAPHY;  Surgeon: Rose Blanks, [name];  Location: [facility];  Service: Cardiovascular;  Laterality: N/A;  . PLACEMENT OF BREAST IMPLANTS  1998  . TUBAL LIGATION  1988  . VAGINAL HYSTERECTOMY  2004    Current Medications: Current Meds  Medication Sig  . albuterol (PROVENTIL HFA;VENTOLIN HFA) 108 (90 BASE) MCG/ACT inhaler Inhale two puffs every 4-6 hours only as needed for shortness of breath or wheezing. (Patient taking differently: Inhale 2 puffs into the lungs every 4 (four) hours as needed for wheezing or shortness of breath. )  . albuterol (PROVENTIL) (2.5 MG/3ML) 0.083% nebulizer solution Take 3 mLs (2.5 mg total) by nebulization every 6 (six) hours as needed for wheezing.  Marland Kitchen ALPRAZolam (XANAX) 0.25 MG tablet TAKE 1 TABLET BY MOUTH TWICE DAILY AS NEEDED FOR SLEEP (Patient taking differently: Take 0.5 mg by mouth 4 times daily as needed for anxiety)  . AMBULATORY NON FORMULARY MEDICATION Cpap titration and mask fitting Dx sleep apnea  . aspirin EC 81 MG tablet Take 81 mg by mouth daily.  Marland Kitchen atorvastatin (LIPITOR) 40 MG tablet Take 1 tablet (40 mg total) by  mouth daily at 6 PM.  . buPROPion (WELLBUTRIN SR) 150 MG 12 hr tablet Take 150 mg by mouth once daily  . Carboxymethylcellul-Glycerin (REFRESH OPTIVE OP) Place 2 drops into both eyes daily.  . clopidogrel (PLAVIX) 75 MG tablet Take 1 tablet (75 mg total) by mouth daily with breakfast.  . nitroGLYCERIN (NITROSTAT) 0.4 MG SL tablet Place 1 tablet (0.4 mg total) under the tongue every 5 (five) minutes as needed.  . predniSONE (DELTASONE) 5 MG tablet Take 5 mg by mouth daily.      Allergies:   Naproxen   Social History   Socioeconomic History  . Marital status: Married    Spouse name: Not on file  . Number of children:  Not on file  . Years of education: Not on file  . Highest education level: Not on file  Occupational History  . Occupation: Agricultural consultant: [employer]  Social Needs  . Financial resource strain: Not on file  . Food insecurity:    Worry: Not on file    Inability: Not on file  . Transportation needs:    Medical: Not on file    Non-medical: Not on file  Tobacco Use  . Smoking status: Current Every Day Smoker    Packs/day: 0.50    Years: 41.00    Pack years: 20.50    Types: Cigarettes  . Smokeless tobacco: Never Used  . Tobacco comment: using vape   Substance and Sexual Activity  . Alcohol use: Not Currently    Frequency: Never  . Drug use: Never  . Sexual activity: Not on file  Lifestyle  . Physical activity:    Days per week: Not on file    Minutes per session: Not on file  . Stress: Not on file  Relationships  . Social connections:    Talks on phone: Not on file    Gets together: Not on file    Attends religious service: Not on file    Active member of club or organization: Not on file    Attends meetings of clubs or organizations: Not on file    Relationship status: Not on file  Other Topics Concern  . Not on file  Social History Narrative  . Not on file     Family History: The patient's family history is detailed in the medical  record reviewed unchanged ROS:   Please see the history of present illness.    All other systems reviewed and are negative.  EKGs/Labs/Other Studies Reviewed:    The following studies were reviewed   Recent Labs: 12/25/2017 hemoglobin 12.3 creatinine 1.06 potassium 5.2 07/01/2017: BUN 19; Creatinine, Ser 0.80; Hemoglobin 12.3; Platelets 165; Potassium 4.2; Sodium 139  Recent Lipid Panel    Component Value Date/Time   CHOL 203 (H) 03/22/2012 1038   TRIG 162 (H) 03/22/2012 1038   HDL 57 03/22/2012 1038   CHOLHDL 3.6 03/22/2012 1038   VLDL 32 03/22/2012 1038   LDLCALC 114 (H) 03/22/2012 1038    Physical Exam:    VS:  BP 132/80 (BP Location: Right Arm, Patient Position: Sitting, Cuff Size: Normal)   Pulse 69   Ht 5\' 5"  (1.651 m)   Wt 115 lb 12.8 oz (52.5 kg)   SpO2 98%   BMI 19.27 kg/m     Wt Readings from Last 3 Encounters:  12/31/17 115 lb 12.8 oz (52.5 kg)  07/12/17 101 lb 12.8 oz (46.2 kg)  07/01/17 101 lb 6.6 oz (46 kg)     GEN:  Well nourished, well developed in no acute distress HEENT: Normal NECK: No JVD; No carotid bruits LYMPHATICS: No lymphadenopathy CARDIAC: RRR, no murmurs, rubs, gallops RESPIRATORY:  Clear to auscultation without rales, wheezing or rhonchi  ABDOMEN: Soft, non-tender, non-distended MUSCULOSKELETAL:  No edema; No deformity  SKIN: Warm and dry NEUROLOGIC:  Alert and oriented x 3 PSYCHIATRIC:  Normal affect    Signed, Rose More, [name]  12/31/2017 11:51 AM    [place]

## 2017-12-31 NOTE — Patient Instructions (Signed)

## 2018-02-28 ENCOUNTER — Other Ambulatory Visit: Payer: Self-pay | Admitting: Cardiology

## 2018-03-29 IMAGING — DX DG CHEST 2V
2 series · 2 of 2 positions shown · non-contrast
Comparison: 06/07/2013

CLINICAL DATA: Central chest pain, preop for heart catheterization

EXAM:
CHEST - 2 VIEW

[chest pa]
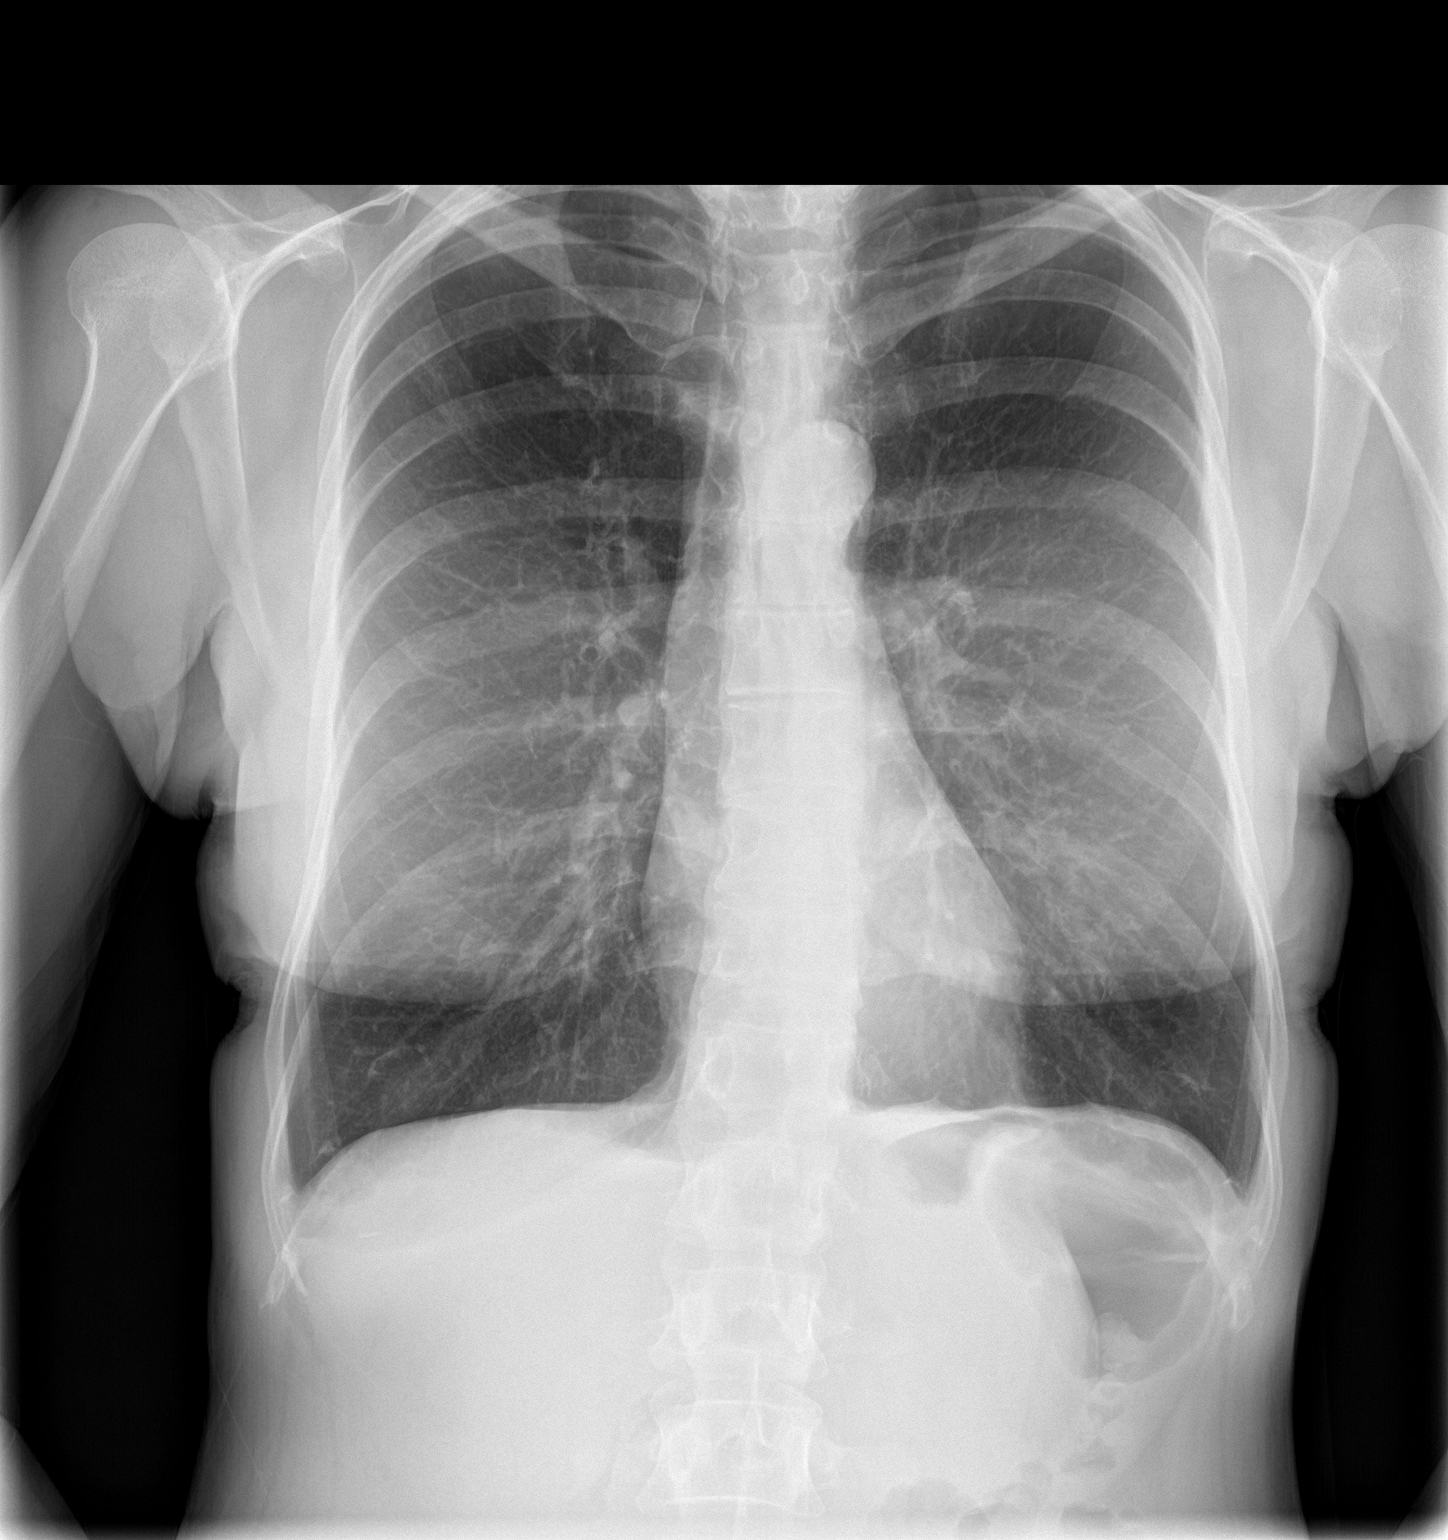

[chest lat]
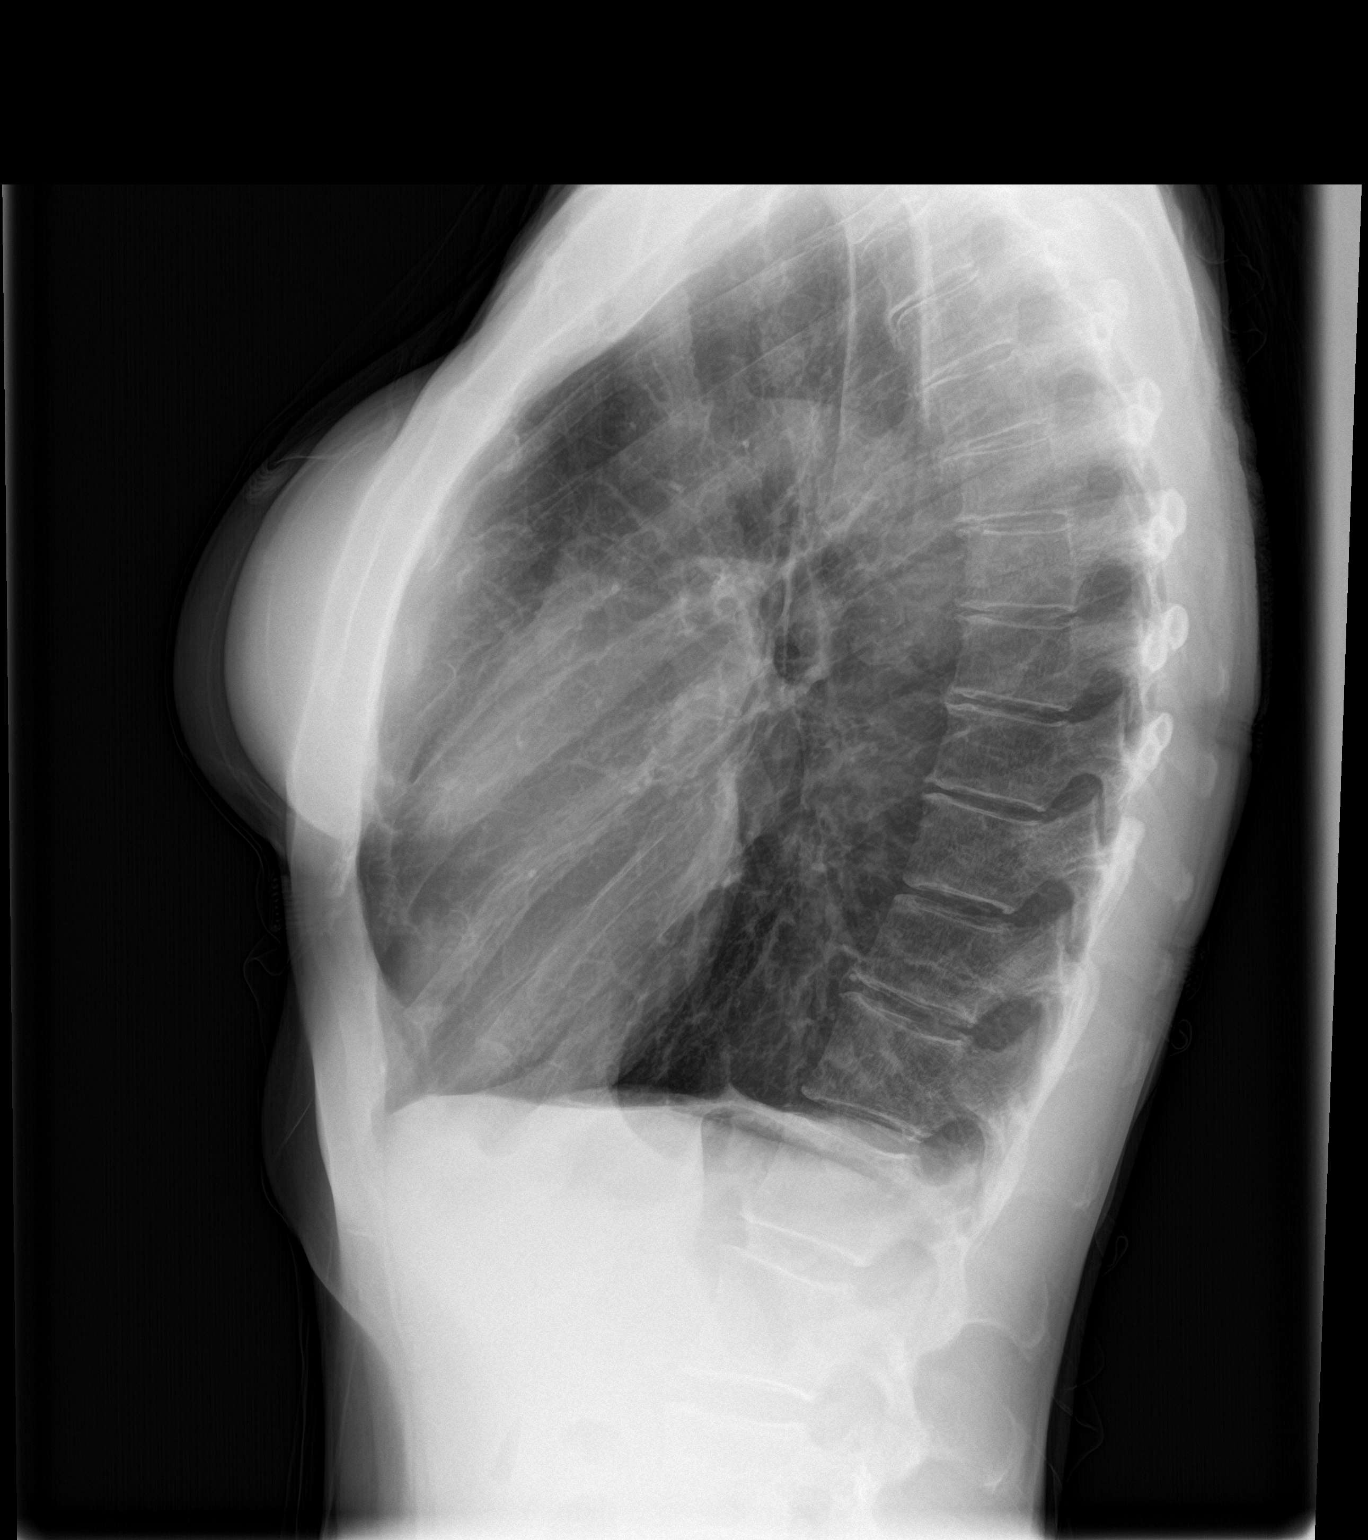

[2 of 2 positions shown; findings below may reference images not displayed]

FINDINGS: Hyperinflation/COPD. Heart and mediastinal contours are within
normal limits. No focal opacities or effusions. No acute bony
abnormality.
IMPRESSION: COPD.  No active disease.

## 2018-06-30 ENCOUNTER — Telehealth: Payer: Self-pay | Admitting: Cardiology

## 2018-06-30 MED ORDER — CLOPIDOGREL BISULFATE 75 MG PO TABS: ORAL_TABLET | ORAL | 1 refills | Status: DC

## 2018-06-30 NOTE — Telephone Encounter (Signed)
°*  STAT* If patient is at the pharmacy, call can be transferred to refill team.   1. Which medications need to be refilled?Plavix  2. Which pharmacy/location (including street and city if local pharmacy) is medication to be sent to? [facility], [address] in [place], [place]  3. Do they need a 30 day or 90 day supply? [place]

## 2018-06-30 NOTE — Telephone Encounter (Signed)
Refill for plavix sent to [facility] in [place], [place] as requested.

## 2018-12-27 ENCOUNTER — Other Ambulatory Visit: Payer: Self-pay

## 2018-12-27 MED ORDER — CLOPIDOGREL BISULFATE 75 MG PO TABS: ORAL_TABLET | ORAL | 0 refills | Status: DC

## 2019-02-06 ENCOUNTER — Other Ambulatory Visit: Payer: Self-pay | Admitting: Cardiology

## 2019-02-06 MED ORDER — CLOPIDOGREL BISULFATE 75 MG PO TABS: ORAL_TABLET | ORAL | 0 refills | Status: DC

## 2019-02-06 NOTE — Telephone Encounter (Signed)
°  Patient is almost out, has virtual appt 11/11 [place]   1. Which medications need to be refilled? (please list name of each medication and dose if known) Plavix  2. Which pharmacy/location (including street and city if local pharmacy) is medication to be sent to?[facility], [address], [place] [address]  3. Do they need a 30 day or 90 day supply? [place]

## 2019-02-06 NOTE — Telephone Encounter (Signed)
Rx for plavix sent to pharmacy as requested.

## 2019-02-15 ENCOUNTER — Encounter: Payer: Self-pay | Admitting: Cardiology

## 2019-02-15 ENCOUNTER — Other Ambulatory Visit: Payer: Self-pay

## 2019-02-15 ENCOUNTER — Telehealth (INDEPENDENT_AMBULATORY_CARE_PROVIDER_SITE_OTHER): Payer: BC Managed Care – PPO | Admitting: Cardiology

## 2019-02-15 VITALS — BP 141/78 | HR 69 | Wt 140.0 lb

## 2019-02-15 DIAGNOSIS — I25119 Atherosclerotic heart disease of native coronary artery with unspecified angina pectoris: Secondary | ICD-10-CM

## 2019-02-15 DIAGNOSIS — I10 Essential (primary) hypertension: Secondary | ICD-10-CM

## 2019-02-15 DIAGNOSIS — E7800 Pure hypercholesterolemia, unspecified: Secondary | ICD-10-CM | POA: Diagnosis not present

## 2019-02-15 NOTE — Patient Instructions (Signed)
Medication Instructions:  Your physician recommends that you continue on your current medications as directed. Please refer to the Current Medication list given to you today.  *If you need a refill on your cardiac medications before your next appointment, please call your pharmacy*  Lab Work: None  If you have labs (blood work) drawn today and your tests are completely normal, you will receive your results only by: Marland Kitchen MyChart Message (if you have MyChart) OR . A paper copy in the mail If you have any lab test that is abnormal or we need to change your treatment, we will call you to review the results.  Testing/Procedures: None  Follow-Up: At [facility], you and your health needs are our priority.  As part of our continuing mission to provide you with exceptional heart care, we have created designated Provider Care Teams.  These Care Teams include your primary Cardiologist (physician) and Advanced Practice Providers (APPs -  Physician Assistants and Nurse Practitioners) who all work together to provide you with the care you need, when you need it.  Your next appointment:   6 months  The format for your next appointment:   In Person  Provider:   Shirlee More, MD  **We will send you a letter in the mail or call you when it is time to schedule your next appointment in 6 months.

## 2019-02-15 NOTE — Progress Notes (Signed)
Virtual Visit via Video Note   This visit type was conducted due to national recommendations for restrictions regarding the COVID-19 Pandemic (e.g. social distancing) in an effort to limit this patient's exposure and mitigate transmission in our community.  Due to her co-morbid illnesses, this patient is at least at moderate risk for complications without adequate follow up.  This format is felt to be most appropriate for this patient at this time.  All issues noted in this document were discussed and addressed.  A limited physical exam was performed with this format.  Please refer to the patient's chart for her consent to telehealth for [facility]. Date:  02/15/2019   ID:  Rose [name], DOB 1965/06/04, MRN [id number]  PCP:  Cher Nakai, MD  Cardiologist:  Shirlee More, MD    Referring MD: Cher Nakai, MD    ASSESSMENT:    1. Coronary artery disease involving native coronary artery of native heart with angina pectoris ([place])   2. Hypertension, benign   3. Pure hypercholesterolemia    PLAN:    In order of problems listed above:  1. Stable CAD more than 1 year remote from  PCI having no angina current medical treatment continue the same including dual antiplatelet and high intensity statin.  She does have a prescription for nitroglycerin if needed.   2. Stable hypertension BP at target 3. Lipids are ideal no side effects continue her high intensity statin  Total 19 minutes spent in visit Next appointment: 6 months in office   Medication Adjustments/Labs and Tests Ordered: Current medicines are reviewed at length with the patient today.  Concerns regarding medicines are outlined above.  No orders of the defined types were placed in this encounter.  No orders of the defined types were placed in this encounter.   Chief Complaint  Patient presents with  . Follow-up  . Coronary Artery Disease    History of Present Illness:    Rose [name] is a 53 y.o. female  with a hx of CAD with PCI and stent proximal LAD 06/30/2017.  Other medical problems include non-Hodgkin's lymphoma hypertension  last seen 12/31/2017. Compliance with diet, lifestyle and medications: Yes  She is presently in [place] with a good video link and is practicing all the precautions of COVID-19.  Her health is good she is not having shortness of breath edema chest pain palpitation or syncope.  We discussed antiplatelet therapy and she would like to remain on dual antiplatelet aspirin and clopidogrel and I told her she had bruising to drop aspirin.  Recent labs were reviewed cholesterol 169 HDL 77 LDL is ideal at 68 liver function test normal hemoglobin 12.3 creatinine 0.95 potassium 4.4.  She is not having side effects from her statin no muscle weakness or pain  Left heart cath 06/30/2017: Conclusion   Mid RCA lesion is 60% stenosed.  Prox Cx lesion is 20% stenosed.  Prox LAD-1 lesion is 80% stenosed.  Prox LAD-2 lesion is 50% stenosed.  A drug-eluting stent was successfully placed using a STENT RESOLUTE ONYX 2.5X18.  Post intervention, there is a 0% residual stenosis.  Post intervention, there is a 0% residual stenosis.  The left ventricular systolic function is normal.  LV end diastolic pressure is normal.  The left ventricular ejection fraction is 55-65% by visual estimate.  There is no mitral valve regurgitation.   1. Severe stenosis proximal LAD.  2. Successful PTCA/DES x 1 proximal LAD 3. Mild non-obstructive disease  in the proximal Circumflex 4. Moderate stenosis in the small caliber, mid RCA 5. Normal LV function     Past Medical History:  Diagnosis Date  . Adult BMI <19 kg/sq m 06/22/2017  . ALLERGIC RHINITIS 11/05/2009   Qualifier: Diagnosis of  By: [name]    . Anxiety 06/22/2017  . Arteritis ([place]) 06/22/2017  . Chronic bronchitis ([place])   . Chronic idiopathic constipation 09/05/2012  . Chronic nausea 06/22/2017  . CHRONIC OBSTRUCTIVE PULMONARY  DISEASE, ACUTE EXACERBATION 10/17/2009   Qualifier: Diagnosis of  By: [name]    . COMMON MIGRAINE 08/17/2008   Qualifier: Diagnosis of  By: [name]    . COPD 05/24/2007   Qualifier: Diagnosis of  By: [name]    . COPD GOLD II 05/24/2007   Refuses any inhalers with black box warning of sudden cardiac death - spirometry 06-26-13 FEV1  2.07 ( 74%) ratio 69 so GOLD II barely but still smoking     . Coronary artery disease   . Cough 06/10/2013   Followed in Pulmonary clinic/ [facility]/ [place]    . DEPRESSION 05/24/2007   Qualifier: Diagnosis of  By: [name]    . Depression 05/24/2007  . Dysuria 01/06/2010   Qualifier: Diagnosis of  By: [name]    . FATIGUE 01/06/2010   Qualifier: Diagnosis of  By: [name]    . GERD without esophagitis 06/22/2017  . Hemoptysis 06/10/2013   Followed in Pulmonary clinic/ [facility]/ [place]    . History of hiatal hernia   . History of kidney stones   . Hyperlipidemia LDL goal < 160 2007-06-27   [place] Risk 3% Dec 2013    . Hypertension, benign 04/18/2007  . Impaired fasting glucose 09/17/2009   Qualifier: Diagnosis of  By: Birdie Riddle MD, Belenda Cruise    . Migraine    "take RX qd; still have ~ 1/wk" (06/30/2017)  . Non-Hodgkin lymphoma ([place]) dx'd 03/2014  . OSA on CPAP   . OSA on CPAP 02/26/2014   CPAP titration to 13 CWP, AHI 0 per hour. She wore an extra small ResMed AirFit F-10 fullface mask with heated humidifier   . Other insomnia 06/22/2017  . PANIC DISORDER 04/18/2007   Qualifier: Diagnosis of  By: Ronnald Ramp MD, Lauren    . Polymyalgia rheumatica ([place]) 12/02/2012   Taper begun November 2014,  7.5 --> 5mg  August 2015   . Shingles (herpes zoster) polyneuropathy 06/22/2017  . Sleep apnea 05/24/2007   Qualifier: Diagnosis of  By: [name]    . SMOKER 04/18/2007   Qualifier: Diagnosis of  By: Ronnald Ramp MD, Lauren    . THYROMEGALY 08/17/2007   Qualifier: Diagnosis of  By: [name]      Past Surgical  History:  Procedure Laterality Date  . APPENDECTOMY  03/2017  . AUGMENTATION MAMMAPLASTY    . CORONARY ANGIOPLASTY WITH STENT PLACEMENT  06/30/2017  . CORONARY STENT INTERVENTION N/A 06/30/2017   Procedure: CORONARY STENT INTERVENTION;  Surgeon: Burnell Blanks, MD;  Location: [facility];  Service: Cardiovascular;  Laterality: N/A;  . DILATION AND CURETTAGE OF UTERUS    . ELBOW FRACTURE SURGERY Left   . FRACTURE SURGERY    . INCONTINENCE SURGERY  2004  . LEFT HEART CATH AND CORONARY ANGIOGRAPHY N/A 06/30/2017   Procedure: LEFT HEART CATH AND CORONARY ANGIOGRAPHY;  Surgeon: Burnell Blanks, MD;  Location: [facility];  Service: Cardiovascular;  Laterality: N/A;  . MOHS SURGERY    . PLACEMENT OF BREAST IMPLANTS  1998  . TUBAL LIGATION  1988  . VAGINAL HYSTERECTOMY  2004    Current Medications: Current Meds  Medication Sig  . albuterol (PROVENTIL HFA;VENTOLIN HFA) 108 (90 BASE) MCG/ACT inhaler Inhale two puffs every 4-6 hours only as needed for shortness of breath or wheezing.  Marland Kitchen albuterol (PROVENTIL) (2.5 MG/3ML) 0.083% nebulizer solution Take 3 mLs (2.5 mg total) by nebulization every 6 (six) hours as needed for wheezing.  Marland Kitchen ALPRAZolam (XANAX) 0.25 MG tablet TAKE 1 TABLET BY MOUTH TWICE DAILY AS NEEDED FOR SLEEP (Patient taking differently: Take 0.5 mg by mouth 4 times daily as needed for anxiety)  . AMBULATORY NON FORMULARY MEDICATION Cpap titration and mask fitting Dx sleep apnea  . aspirin EC 81 MG tablet Take 81 mg by mouth daily.  Marland Kitchen atorvastatin (LIPITOR) 40 MG tablet Take 1 tablet (40 mg total) by mouth daily at 6 PM.  . buPROPion (WELLBUTRIN SR) 150 MG 12 hr tablet Take 150 mg by mouth once daily  . Carboxymethylcellul-Glycerin (REFRESH OPTIVE OP) Place 2 drops into both eyes daily.  . clopidogrel (PLAVIX) 75 MG tablet TAKE 1 TABLET(75 MG) BY MOUTH DAILY WITH BREAKFAST  . nitroGLYCERIN (NITROSTAT) 0.4 MG SL tablet Place 1 tablet (0.4 mg total) under  the tongue every 5 (five) minutes as needed.  . predniSONE (DELTASONE) 5 MG tablet Take 5 mg by mouth daily.      Allergies:   Naproxen   Social History   Socioeconomic History  . Marital status: Married    Spouse name: Not on file  . Number of children: Not on file  . Years of education: Not on file  . Highest education level: Not on file  Occupational History  . Occupation: Agricultural consultant: [employer]  Social Needs  . Financial resource strain: Not on file  . Food insecurity    Worry: Not on file    Inability: Not on file  . Transportation needs    Medical: Not on file    Non-medical: Not on file  Tobacco Use  . Smoking status: Current Every Day Smoker    Packs/day: 0.50    Years: 41.00    Pack years: 20.50    Types: Cigarettes  . Smokeless tobacco: Never Used  . Tobacco comment: using vape   Substance and Sexual Activity  . Alcohol use: Not Currently    Frequency: Never  . Drug use: Never  . Sexual activity: Not on file  Lifestyle  . Physical activity    Days per week: Not on file    Minutes per session: Not on file  . Stress: Not on file  Relationships  . Social Herbalist on phone: Not on file    Gets together: Not on file    Attends religious service: Not on file    Active member of club or organization: Not on file    Attends meetings of clubs or organizations: Not on file    Relationship status: Not on file  Other Topics Concern  . Not on file  Social History Narrative  . Not on file     Family History: The patient's family history includes Anemia in her mother; Breast cancer in her mother; Heart attack in her brother, father, maternal grandfather, paternal grandfather, and sister; Heart disease in her brother, father, maternal grandfather, paternal grandfather, and sister; Hyperlipidemia in her brother, father, maternal  grandfather, maternal grandmother, mother, paternal grandfather, paternal grandmother, and sister;  Hypertension in her brother, father, maternal grandfather, maternal grandmother, mother, paternal grandfather, paternal grandmother, and sister; Prostate cancer in her brother and father; Skin cancer in her father; Stroke in her maternal grandfather. ROS:   Please see the history of present illness.    All other systems reviewed and are negative.  EKGs/Labs/Other Studies Reviewed:    The following studies were reviewed today   Physical Exam:    VS:  BP (!) 141/78 (BP Location: Left Arm)   Pulse 69   Wt 140 lb (63.5 kg)   SpO2 98%   BMI 23.30 kg/m     Wt Readings from Last 3 Encounters:  02/15/19 140 lb (63.5 kg)  12/31/17 115 lb 12.8 oz (52.5 kg)  07/12/17 101 lb 12.8 oz (46.2 kg)    Constitutional, well-nourished well-developed in no acute distress Vital signs reviewed Eyes, conjunctiva and sclera are normal without pallor or icterus extraocular motions intact and normal there is no lid lag Respiratory, normal effort and excursion no audible wheezing without a stethoscope Cardiovascular, no neck vein distention or peripheral edema Skin, no rash skin lesion or ulceration of the extremities Neurologic, cranial nerves II to XII are grossly intact and the patient moves all 4 extremities Neuro/Psychiatric, judgment and thought processes are intact and coherent, alert and oriented x3, mood and affect appear normal.   Signed, Shirlee More, MD  02/15/2019 1:32 PM    [place]

## 2019-02-16 ENCOUNTER — Other Ambulatory Visit: Payer: Self-pay

## 2019-02-16 MED ORDER — CLOPIDOGREL BISULFATE 75 MG PO TABS: ORAL_TABLET | ORAL | 1 refills | Status: DC

## 2019-03-27 ENCOUNTER — Encounter: Payer: Self-pay | Admitting: Gastroenterology

## 2019-04-07 HISTORY — PX: UPPER GASTROINTESTINAL ENDOSCOPY: SHX188

## 2019-05-25 ENCOUNTER — Ambulatory Visit: Payer: BC Managed Care – PPO | Admitting: Gastroenterology

## 2019-06-08 ENCOUNTER — Encounter: Payer: Self-pay | Admitting: Gastroenterology

## 2019-06-08 ENCOUNTER — Ambulatory Visit: Payer: BC Managed Care – PPO | Admitting: Gastroenterology

## 2019-06-08 ENCOUNTER — Telehealth: Payer: Self-pay

## 2019-06-08 ENCOUNTER — Other Ambulatory Visit: Payer: Self-pay

## 2019-06-08 VITALS — BP 136/74 | HR 82 | Temp 98.2°F | Ht 65.0 in | Wt 137.5 lb

## 2019-06-08 DIAGNOSIS — Z01818 Encounter for other preprocedural examination: Secondary | ICD-10-CM

## 2019-06-08 DIAGNOSIS — K589 Irritable bowel syndrome without diarrhea: Secondary | ICD-10-CM

## 2019-06-08 DIAGNOSIS — K219 Gastro-esophageal reflux disease without esophagitis: Secondary | ICD-10-CM

## 2019-06-08 DIAGNOSIS — C8590 Non-Hodgkin lymphoma, unspecified, unspecified site: Secondary | ICD-10-CM

## 2019-06-08 MED ORDER — CLENPIQ 10-3.5-12 MG-GM -GM/160ML PO SOLN: 1.0000 | Freq: Once | ORAL | 0 refills | Status: AC

## 2019-06-08 MED ORDER — LINACLOTIDE 72 MCG PO CAPS: 72.0000 ug | ORAL_CAPSULE | Freq: Every day | ORAL | 0 refills | Status: DC

## 2019-06-08 MED ORDER — PANTOPRAZOLE SODIUM 40 MG PO TBEC: 40.0000 mg | DELAYED_RELEASE_TABLET | Freq: Every day | ORAL | 11 refills | Status: DC

## 2019-06-08 NOTE — Progress Notes (Signed)
Chief Complaint: For EGD/colon  Referring Provider:  Cher Nakai, MD      ASSESSMENT AND PLAN;   #1. GERD  #2.  H/O colonic polyps   #3. IBS with constipation  #3. 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 S/P XRT. Being followed by [facility] [place] by regular CT chest/Abdo/pelvis.   Plan: - Linzess 72 mcg p.o. once a day.  I have given her samples. - protonix 40mg  po qd, #30, 11 refills. - Proceed with EGD/colon with 2 day prep, off plavix (Dr Bettina Gavia). Discussed risks & benefits. (Risks including rare perforation req laparotomy, bleeding after bx/polypectomy req blood transfusion, rarely missing neoplasms, risks of anesthesia/sedation). Benefits outweigh the risks. Patient agrees to proceed. All the questions were answered. Consent forms given for review. -I have instructed patient to stop smoking.  Have discussed risks associated with smoking including risks of various cancers.     HPI:    Rose [name] is a 54 y.o. 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  Recently had CT chest Abdo/pelvis which apparently showed lymph nodes.  She has follow-up appointment with [facility] [place] in 4-6 weeks.  She has been advised to get EGD/colonoscopy performed prior.  From the GI standpoint she does complain of heartburn without odynophagia or dysphagia.  No fever chills or night sweats.  Has history of longstanding chronic constipation with pellet-like stools, better with Linzess 145 (which does cause her to have diarrhea).  Hence she takes it 1 or 2 times per week.  No melena or hematochezia (except once when she had problems with hemorrhoids).  Past GI procedures: -Colonoscopy 02/2016 ([facility]) Limited preparation.  Colonic polyps s/p polypectomy. Bx- TAs.  Advised to repeat in 1 year.  Past Medical History:  Diagnosis Date  . Adult BMI <19 kg/sq m 06/22/2017  . ALLERGIC RHINITIS 11/05/2009    Qualifier: Diagnosis of  By: [name]    . Anxiety 06/22/2017  . Arteritis ([place]) 06/22/2017  . Arteritis ([place])   . Chronic bronchitis ([place])   . Chronic idiopathic constipation 09/05/2012  . Chronic nausea 06/22/2017  . CHRONIC OBSTRUCTIVE PULMONARY DISEASE, ACUTE EXACERBATION 10/17/2009   Qualifier: Diagnosis of  By: [name]    . COMMON MIGRAINE 08/17/2008   Qualifier: Diagnosis of  By: [name]    . COPD 05/24/2007   Qualifier: Diagnosis of  By: [name]    . 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 Coronary artery disease   . Cough 06/10/2013   Followed in Pulmonary clinic/ [facility]/ [place]    . DEPRESSION 05/24/2007   Qualifier: Diagnosis of  By: [name]    . Depression 05/24/2007  . Dysuria 01/06/2010   Qualifier: Diagnosis of  By: [name]    . FATIGUE 01/06/2010   Qualifier: Diagnosis of  By: [name]    . GERD without esophagitis 06/22/2017  . Hemoptysis 06/10/2013   Followed in Pulmonary clinic/ [facility]/ [place]    . History of hiatal hernia   . History of kidney stones   . Hyperlipidemia LDL goal < 160 07-17-07   [place] Risk 3% Dec 2013    . Hypertension, benign 04/18/2007  .  Impaired fasting glucose 09/17/2009   Qualifier: Diagnosis of  By: Birdie Riddle MD, Belenda Cruise    . Migraine    "take RX qd; still have ~ 1/wk" (06/30/2017)  . Non-Hodgkin lymphoma ([place]) dx'd 03/2014  . OSA on CPAP   . OSA on CPAP 02/26/2014   CPAP titration to 13 CWP, AHI 0 per hour. She wore an extra small ResMed AirFit F-10 fullface mask with heated humidifier   . Other insomnia 06/22/2017  . PANIC DISORDER 04/18/2007   Qualifier: Diagnosis of  By: Ronnald Ramp MD, Lauren    . Polymyalgia rheumatica ([place]) 12/02/2012   Taper begun November 2014,  7.5 --> 5mg  August 2015   . Shingles (herpes zoster) polyneuropathy  06/22/2017  . Sleep apnea 05/24/2007   Qualifier: Diagnosis of  By: [name]    . SMOKER 04/18/2007   Qualifier: Diagnosis of  By: Ronnald Ramp MD, Lauren    . THYROMEGALY 08/17/2007   Qualifier: Diagnosis of  By: [name]      Past Surgical History:  Procedure Laterality Date  . APPENDECTOMY  03/2017  . AUGMENTATION MAMMAPLASTY    . bladder tuck surgery    . COLONOSCOPY  03/04/2016   Colonic polyps status post polypectomy. Small internal hemrrhoids. Monir degree of rectal prolapse  . CORONARY ANGIOPLASTY WITH STENT PLACEMENT  06/30/2017  . CORONARY STENT INTERVENTION N/A 06/30/2017   Procedure: CORONARY STENT INTERVENTION;  Surgeon: Burnell Blanks, MD;  Location: [facility];  Service: Cardiovascular;  Laterality: N/A;  . DILATION AND CURETTAGE OF UTERUS    . ELBOW FRACTURE SURGERY Left   . ESOPHAGOGASTRODUODENOSCOPY     Dr Dennison Nancy  . FRACTURE SURGERY    . INCONTINENCE SURGERY  2004  . laparoscopy and mesenteric and omental biopsies  03/16/2014   Dr Pauletta Browns  . LEFT HEART CATH AND CORONARY ANGIOGRAPHY N/A 06/30/2017   Procedure: LEFT HEART CATH AND CORONARY ANGIOGRAPHY;  Surgeon: Burnell Blanks, MD;  Location: [facility];  Service: Cardiovascular;  Laterality: N/A;  . MOHS SURGERY    . PLACEMENT OF BREAST IMPLANTS  1998  . TUBAL LIGATION  1988  . VAGINAL HYSTERECTOMY  2004    Family History  Problem Relation Age of Onset  . Breast cancer Mother   . Anemia Mother   . Hypertension Mother   . Hyperlipidemia Mother   . Prostate cancer Father   . Heart disease Father   . Hypertension Father   . Skin cancer Father   . Hyperlipidemia Father   . Heart attack Father   . Heart disease Brother   . Hypertension Brother   . Prostate cancer Brother   . Hyperlipidemia Brother   . Heart attack Brother   . Heart disease Sister   . Hypertension Sister   . Hyperlipidemia Sister   . Heart attack Sister   . Hypertension Maternal Grandmother    . Hyperlipidemia Maternal Grandmother   . Hypertension Maternal Grandfather   . Hyperlipidemia Maternal Grandfather   . Stroke Maternal Grandfather   . Heart attack Maternal Grandfather   . Heart disease Maternal Grandfather   . Hypertension Paternal Grandmother   . Hyperlipidemia Paternal Grandmother   . Hypertension Paternal Grandfather   . Hyperlipidemia Paternal Grandfather   . Heart attack Paternal Grandfather   . Heart disease Paternal Grandfather     Social History   Tobacco Use  . Smoking status: Current Every Day Smoker    Packs/day: 0.50    Years: 41.00  Pack years: 20.50    Types: Cigarettes  . Smokeless tobacco: Never Used  . Tobacco comment: using vape. Does 5 cigs a day   Substance Use Topics  . Alcohol use: Not Currently  . Drug use: Never    Current Outpatient Medications  Medication Sig Dispense Refill  . albuterol (PROVENTIL HFA;VENTOLIN HFA) 108 (90 BASE) MCG/ACT inhaler Inhale two puffs every 4-6 hours only as needed for shortness of breath or wheezing. 1 Inhaler 1  . albuterol (PROVENTIL) (2.5 MG/3ML) 0.083% nebulizer solution Take 3 mLs (2.5 mg total) by nebulization every 6 (six) hours as needed for wheezing. 75 mL 12  . ALPRAZolam (XANAX) 0.25 MG tablet TAKE 1 TABLET BY MOUTH TWICE DAILY AS NEEDED FOR SLEEP (Patient taking differently: Take 0.5 mg by mouth 4 times daily as needed for anxiety) 45 tablet 0  . AMBULATORY NON FORMULARY MEDICATION Cpap titration and mask fitting Dx sleep apnea 1 each 0  . aspirin EC 81 MG tablet Take 81 mg by mouth daily.    Marland Kitchen atorvastatin (LIPITOR) 40 MG tablet Take 1 tablet (40 mg total) by mouth daily at 6 PM. 90 tablet 0  . buPROPion (WELLBUTRIN SR) 150 MG 12 hr tablet Take 150 mg by mouth once daily  0  . Carboxymethylcellul-Glycerin (REFRESH OPTIVE OP) Place 2 drops into both eyes daily.    . clopidogrel (PLAVIX) 75 MG tablet TAKE 1 TABLET(75 MG) BY MOUTH DAILY WITH BREAKFAST 90 tablet 1  . LINZESS 145 MCG CAPS  capsule Take 145 mcg by mouth as needed.    . nitroGLYCERIN (NITROSTAT) 0.4 MG SL tablet Place 1 tablet (0.4 mg total) under the tongue every 5 (five) minutes as needed. 25 tablet 2  . predniSONE (DELTASONE) 5 MG tablet Take 5 mg by mouth daily.  45 tablet 2  . TRINTELLIX 10 MG TABS tablet Take 10 mg by mouth daily.     No current facility-administered medications for this visit.    Allergies  Allergen Reactions  . Naproxen Anaphylaxis and Other (See Comments)    Throat closes can't breathe    Review of Systems:  Constitutional: Denies fever, chills, diaphoresis, appetite change and fatigue.  HEENT: Denies photophobia, eye pain, redness, hearing loss, ear pain, congestion, sore throat, rhinorrhea, sneezing, mouth sores, neck pain, neck stiffness and tinnitus.   Respiratory: Denies SOB, DOE, cough, chest tightness,  and wheezing.   Cardiovascular: Denies chest pain, palpitations and leg swelling.  Genitourinary: Denies dysuria, urgency, frequency, hematuria, flank pain and difficulty urinating.  Musculoskeletal: Denies myalgias, back pain, joint swelling, arthralgias and gait problem.  Skin: No rash.  Neurological: Denies dizziness, seizures, syncope, weakness, light-headedness, numbness and headaches.  Hematological: Denies adenopathy. Easy bruising, personal or family bleeding history  Psychiatric/Behavioral: Has anxiety or depression     Physical Exam:    BP 136/74   Pulse 82   Temp 98.2 F (36.8 C)   Ht 5\' 5"  (1.651 m)   Wt 137 lb 8 oz (62.4 kg)   BMI 22.88 kg/m  Wt Readings from Last 3 Encounters:  06/08/19 137 lb 8 oz (62.4 kg)  02/15/19 140 lb (63.5 kg)  12/31/17 115 lb 12.8 oz (52.5 kg)   Constitutional:  Well-developed, in no acute distress. Psychiatric: Normal mood and affect. Behavior is normal. HEENT: Pupils normal.  Conjunctivae are normal. No scleral icterus. Neck supple.  Cardiovascular: Normal rate, regular rhythm. No edema Pulmonary/chest: Effort normal  and breath sounds-decreased l. No wheezing, rales or rhonchi. Abdominal: Soft,  nondistended. Nontender. Bowel sounds active throughout. There are no masses palpable. No hepatomegaly.  Well-healed surgical scars. Rectal:  defered Neurological: Alert and oriented to person place and time. Skin: Skin is warm and dry. No rashes noted.  Data Reviewed: I have personally reviewed following labs and imaging studies  CBC: CBC Latest Ref Rng & Units 07/01/2017 06/28/2017 06/07/2013  WBC 4.0 - 10.5 K/uL 4.6 5.5 7.6  Hemoglobin 12.0 - 15.0 g/dL 12.3 13.6 14.6  Hematocrit 36.0 - 46.0 % 37.1 39.8 42.4  Platelets 150 - 400 K/uL 165 212 258    CMP: CMP Latest Ref Rng & Units 07/01/2017 06/28/2017 04/29/2012  Glucose 65 - 99 mg/dL 75 96 76  BUN 6 - 20 mg/dL 19 19 10   Creatinine 0.44 - 1.00 mg/dL 0.80 0.91 0.90  Sodium 135 - 145 mmol/L 139 143 145  Potassium 3.5 - 5.1 mmol/L 4.2 5.1 3.8  Chloride 101 - 111 mmol/L 109 106 101  CO2 22 - 32 mmol/L 21(L) 22 32  Calcium 8.9 - 10.3 mg/dL 8.6(L) 9.5 9.9  Total Protein 6.0 - 8.3 g/dL - - 7.4  Total Bilirubin 0.3 - 1.2 mg/dL - - 0.3  Alkaline Phos 39 - 117 U/L - - 116  AST 0 - 37 U/L - - 14  ALT 0 - 35 U/L - - 13    GFR: CrCl cannot be calculated (Patient's most recent lab result is older than the maximum 21 days allowed.). Liver Function Tests: No results for input(s): AST, ALT, ALKPHOS, BILITOT, PROT, ALBUMIN in the last 168 hours. No results for input(s): LIPASE, AMYLASE in the last 168 hours. No results for input(s): AMMONIA in the last 168 hours. Coagulation Profile: No results for input(s): INR, PROTIME in the last 168 hours. HbA1C: No results for input(s): HGBA1C in the last 72 hours. Lipid Profile: No results for input(s): CHOL, HDL, LDLCALC, TRIG, CHOLHDL, LDLDIRECT in the last 72 hours. Thyroid Function Tests: No results for input(s): TSH, T4TOTAL, FREET4, T3FREE, THYROIDAB in the last 72 hours. Anemia Panel: No results for input(s):  VITAMINB12, FOLATE, FERRITIN, TIBC, IRON, RETICCTPCT in the last 72 hours.  No results found for this or any previous visit (from the past 240 hour(s)).    Radiology Studies: No results found.    Carmell Austria, MD 06/08/2019, 3:19 PM  Cc: Cher Nakai, MD

## 2019-06-08 NOTE — Patient Instructions (Signed)
If you are age 56 or older, your body mass index should be between 23-30. Your Body mass index is 22.88 kg/m. If this is out of the aforementioned range listed, please consider follow up with your Primary Care Provider.  If you are age 46 or younger, your body mass index should be between 19-25. Your Body mass index is 22.88 kg/m. If this is out of the aformentioned range listed, please consider follow up with your Primary Care Provider.   We have given you samples of the following medication to take: Clenpiq  Linzess   We have sent the following medications to your pharmacy for you to pick up at your convenience: Protonix   Two days before your procedure: Mix 3 packs (or capfuls) of Miralax in 48 ounces of clear liquid and drink at 6pm.  You will be contacted by our office prior to your procedure for directions on holding your Plavix.  If you do not hear from our office 1 week prior to your scheduled procedure, please call [phone] to discuss.   Thank you,  Dr. Jackquline Denmark

## 2019-06-08 NOTE — Telephone Encounter (Signed)
[facility] Pre-operative Risk Assessment     Request for surgical clearance:     Endoscopy Procedure  What type of surgery is being performed?     Colon/EGD  When is this surgery scheduled?     06/19/19  What type of clearance is required ?   Pharmacy  Are there any medications that need to be held prior to surgery and how long? Plavix x 5 days   Practice name and name of physician performing surgery?      [facility]  What is your office phone and fax number?      Phone- [phone]  [phone]  Anesthesia type (None, local, MAC, general) ?       MAC

## 2019-06-08 NOTE — Telephone Encounter (Signed)
Dr. Bettina Gavia, please review. Patient's last PCI was in 06/2017. Can she hold plavix for 5 days prior to colonoscopy and endoscopy? I spoke with the patient over the phone, since last virtual visit in Nov 2020, she have been doing well without any exertional chest pain or shortness of breath.   Please send your response to P CV DIV PREOP

## 2019-06-09 ENCOUNTER — Telehealth: Payer: Self-pay

## 2019-06-09 NOTE — Telephone Encounter (Signed)
   Primary Cardiologist: Shirlee More, MD  Chart reviewed as part of pre-operative protocol coverage.   Per Dr. Bettina Gavia, patient can hold plavix 5 days prior to his upcoming EGD/colonoscopy. Plavix should be restarted as soon as he is cleared to do so by his gastroenterologist.   I will route this recommendation to the requesting party via Epic fax function and remove from pre-op pool.  Please call with questions.  Abigail Butts, PA-C 06/09/2019, 12:13 PM

## 2019-06-09 NOTE — Telephone Encounter (Signed)
Patient was made aware to hold Plavix 5 days prior to procedure and she said she understood this.

## 2019-06-09 NOTE — Telephone Encounter (Signed)
Yes can hold clopidogrel

## 2019-06-15 ENCOUNTER — Other Ambulatory Visit: Payer: Self-pay

## 2019-06-15 ENCOUNTER — Other Ambulatory Visit: Payer: Self-pay | Admitting: Gastroenterology

## 2019-06-15 ENCOUNTER — Ambulatory Visit (INDEPENDENT_AMBULATORY_CARE_PROVIDER_SITE_OTHER): Payer: BC Managed Care – PPO

## 2019-06-15 DIAGNOSIS — Z1159 Encounter for screening for other viral diseases: Secondary | ICD-10-CM

## 2019-06-16 LAB — SARS CORONAVIRUS 2 (TAT 6-24 HRS): SARS Coronavirus 2: NEGATIVE

## 2019-06-19 ENCOUNTER — Encounter: Payer: Self-pay | Admitting: Gastroenterology

## 2019-06-19 ENCOUNTER — Other Ambulatory Visit: Payer: Self-pay

## 2019-06-19 ENCOUNTER — Ambulatory Visit (AMBULATORY_SURGERY_CENTER): Payer: BC Managed Care – PPO | Admitting: Gastroenterology

## 2019-06-19 VITALS — BP 116/74 | HR 72 | Temp 97.1°F | Resp 18 | Ht 65.0 in | Wt 137.0 lb

## 2019-06-19 DIAGNOSIS — K2951 Unspecified chronic gastritis with bleeding: Secondary | ICD-10-CM | POA: Diagnosis not present

## 2019-06-19 DIAGNOSIS — K2970 Gastritis, unspecified, without bleeding: Secondary | ICD-10-CM

## 2019-06-19 DIAGNOSIS — Z1211 Encounter for screening for malignant neoplasm of colon: Secondary | ICD-10-CM

## 2019-06-19 DIAGNOSIS — K219 Gastro-esophageal reflux disease without esophagitis: Secondary | ICD-10-CM

## 2019-06-19 DIAGNOSIS — K589 Irritable bowel syndrome without diarrhea: Secondary | ICD-10-CM

## 2019-06-19 DIAGNOSIS — K581 Irritable bowel syndrome with constipation: Secondary | ICD-10-CM | POA: Diagnosis not present

## 2019-06-19 HISTORY — PX: COLONOSCOPY: SHX174

## 2019-06-19 MED ORDER — SODIUM CHLORIDE 0.9 % IV SOLN: 500.0000 mL | Freq: Once | INTRAVENOUS | Status: AC

## 2019-06-19 NOTE — Patient Instructions (Signed)
YOU HAD AN ENDOSCOPIC PROCEDURE TODAY AT [facility]:   Refer to the procedure report that was given to you for any specific questions about what was found during the examination.  If the procedure report does not answer your questions, please call your gastroenterologist to clarify.  If you requested that your care partner not be given the details of your procedure findings, then the procedure report has been included in a sealed envelope for you to review at your convenience later.  YOU SHOULD EXPECT: Some feelings of bloating in the abdomen. Passage of more gas than usual.  Walking can help get rid of the air that was put into your GI tract during the procedure and reduce the bloating. If you had a lower endoscopy (such as a colonoscopy or flexible sigmoidoscopy) you may notice spotting of blood in your stool or on the toilet paper. If you underwent a bowel prep for your procedure, you may not have a normal bowel movement for a few days.  Please Note:  You might notice some irritation and congestion in your nose or some drainage.  This is from the oxygen used during your procedure.  There is no need for concern and it should clear up in a day or so.  SYMPTOMS TO REPORT IMMEDIATELY:   Following lower endoscopy (colonoscopy or flexible sigmoidoscopy):  Excessive amounts of blood in the stool  Significant tenderness or worsening of abdominal pains  Swelling of the abdomen that is new, acute  Fever of 100F or higher   Following upper endoscopy (EGD)  Vomiting of blood or coffee ground material  New chest pain or pain under the shoulder blades  Painful or persistently difficult swallowing  New shortness of breath  Fever of 100F or higher  Black, tarry-looking stools  For urgent or emergent issues, a gastroenterologist can be reached at any hour by calling [phone]. Do not use MyChart messaging for urgent concerns.    DIET:  We do recommend a small meal at first, but  then you may proceed to your regular diet.  Drink plenty of fluids but you should avoid alcoholic beverages for 24 hours.  MEDICATIONS: Continue previous medications. Resume Plavix (clopidogrel) at prior dose tomorrow. Continue Protonix 40 mg by mouth once daily for 4 weeks, then you can try it every other day.  Follow up with Dr. Lyndel Safe in his office as needed.  Please see handouts given to you by your recovery nurse.  ACTIVITY:  You should plan to take it easy for the rest of today and you should NOT DRIVE or use heavy machinery until tomorrow (because of the sedation medicines used during the test).    FOLLOW UP: Our staff will call the number listed on your records 48-72 hours following your procedure to check on you and address any questions or concerns that you may have regarding the information given to you following your procedure. If we do not reach you, we will leave a message.  We will attempt to reach you two times.  During this call, we will ask if you have developed any symptoms of COVID 19. If you develop any symptoms (ie: fever, flu-like symptoms, shortness of breath, cough etc.) before then, please call [phone].  If you test positive for Covid 19 in the 2 weeks post procedure, please call and report this information to [place].    If any biopsies were taken you will be contacted by phone or by letter within the next 1-3  weeks.  Please call us at [phone] if you have not heard about the biopsies in 3 weeks.   Thank you for allowing [place] to provide for your healthcare needs today.   SIGNATURES/CONFIDENTIALITY: You and/or your care partner have signed paperwork which will be entered into your electronic medical record.  These signatures attest to the fact that that the information above on your After Visit Summary has been reviewed and is understood.  Full responsibility of the confidentiality of this discharge information lies with you and/or your care-partner.

## 2019-06-19 NOTE — Progress Notes (Signed)
Called to room to assist during endoscopic procedure.  Patient ID and intended procedure confirmed with present staff. Received instructions for my participation in the procedure from the performing physician.  

## 2019-06-19 NOTE — Progress Notes (Signed)
Report given to PACU, vss 

## 2019-06-19 NOTE — Op Note (Signed)
[place] Patient Name: Rose [name] Procedure Date: 06/19/2019 2:50 PM MRN: [id number] Endoscopist: Jackquline Denmark , MD Age: 54 Referring MD:  Date of Birth: September 27, 1965 Gender: Female Account #: [id number] Procedure:                Upper GI endoscopy Indications:              #1. GERD. #2. H/O NHL stage IIIb Dx 03/2014 with a                            lap. S/P XRT. Medicines:                Monitored Anesthesia Care Procedure:                Pre-Anesthesia Assessment:                           - Prior to the procedure, a History and Physical                            was performed, and patient medications and                            allergies were reviewed. The patient's tolerance of                            previous anesthesia was also reviewed. The risks                            and benefits of the procedure and the sedation                            options and risks were discussed with the patient.                            All questions were answered, and informed consent                            was obtained. Prior Anticoagulants: The patient has                            taken Plavix (clopidogrel), last dose was 5 days                            prior to procedure. ASA Grade Assessment: III - A                            patient with severe systemic disease. After                            reviewing the risks and benefits, the patient was                            deemed in satisfactory condition to undergo the  procedure.                           After obtaining informed consent, the endoscope was                            passed under direct vision. Throughout the                            procedure, the patient's blood pressure, pulse, and                            oxygen saturations were monitored continuously. The                            Endoscope was introduced through the mouth, and   advanced to the second part of duodenum. The upper                            GI endoscopy was accomplished without difficulty.                            The patient tolerated the procedure well. Scope In: Scope Out: Findings:                 The examined esophagus was normal. Z-line                            well-defined at 40 cm. Examined by NBI.                           Localized minimal inflammation characterized by                            erythema was found in the gastric antrum. Biopsies                            were taken with a cold forceps for histology. GE                            flap valve Hill's grade I on retroflexed                            examination.                           The examined duodenum was normal. Biopsies for                            histology were taken with a cold forceps for                            evaluation of celiac disease. Complications:            No immediate complications. Estimated Blood Loss:     Estimated blood loss: none. Impression:               -  Minimal gastritis. Recommendation:           - Patient has a contact number available for                            emergencies. The signs and symptoms of potential                            delayed complications were discussed with the                            patient. Return to normal activities tomorrow.                            Written discharge instructions were provided to the                            patient.                           - Resume previous diet.                           - Continue Protonix 40 mg p.o. once a day x 4                            weeks, then can try every other day.                           - Resume Plavix (clopidogrel) at prior dose                            tomorrow.                           - Return to GI clinic PRN. Jackquline Denmark, MD 06/19/2019 3:28:34 PM This report has been signed electronically.

## 2019-06-19 NOTE — Op Note (Signed)
Rose [name] Patient Name: Rose [name] Procedure Date: 06/19/2019 2:50 PM MRN: [id number] Endoscopist: Jackquline Denmark , MD Age: 54 Referring MD:  Date of Birth: Jul 22, 1965 Gender: Female Account #: [id number] Procedure:                Colonoscopy Indications:              #1. H/O colonic polyps #2. IBS with constipation                            #3. 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 S/P XRT.                            Followed at [facility]. Medicines:                Monitored Anesthesia Care Procedure:                Pre-Anesthesia Assessment:                           - Prior to the procedure, a History and Physical                            was performed, and patient medications and                            allergies were reviewed. The patient's tolerance of                            previous anesthesia was also reviewed. The risks                            and benefits of the procedure and the sedation                            options and risks were discussed with the patient.                            All questions were answered, and informed consent                            was obtained. Prior Anticoagulants: The patient has                            taken Plavix (clopidogrel), last dose was 5 days                            prior to procedure. ASA Grade Assessment: III - A                            patient with severe systemic disease. After                            reviewing the risks and benefits, the patient was  deemed in satisfactory condition to undergo the                            procedure.                           After obtaining informed consent, the colonoscope                            was passed under direct vision. Throughout the                            procedure, the patient's blood pressure, pulse, and                            oxygen saturations were monitored continuously. The                        Colonoscope was introduced through the anus and                            advanced to the 4 cm into the ileum. The                            colonoscopy was performed without difficulty. The                            patient tolerated the procedure well. The quality                            of the bowel preparation was good. The terminal                            ileum, ileocecal valve, appendiceal orifice, and                            rectum were photographed. Scope In: 3:03:06 PM Scope Out: 3:21:43 PM Scope Withdrawal Time: 0 hours 13 minutes 44 seconds  Total Procedure Duration: 0 hours 18 minutes 37 seconds  Findings:                 A few rare small-mouthed diverticula were found in                            the sigmoid colon.                           Non-bleeding external and internal hemorrhoids were                            found during retroflexion and during perianal exam.                            The hemorrhoids were moderate. Minor degree of  rectal prolapse.                           The terminal ileum appeared normal.                           The exam was otherwise without abnormality on                            direct and retroflexion views. The colon was highly                            redundant. Complications:            No immediate complications. Estimated Blood Loss:     Estimated blood loss: none. Impression:               -Very minimal sigmoid diverticulosis.                           -Non-bleeding external and internal hemorrhoids.                           -Otherwise normal colonoscopy to TI. The colon was                            highly redundant. Recommendation:           - Patient has a contact number available for                            emergencies. The signs and symptoms of potential                            delayed complications were discussed with the                             patient. Return to normal activities tomorrow.                            Written discharge instructions were provided to the                            patient.                           - Resume previous diet.                           - Continue present medications.                           - Resume Plavix from tomorrow onwards.                           - Repeat colonoscopy in 10 years for screening  purposes. Earlier, if with any new problems or if                            there is any change in family history.                           - Return to GI office PRN.                           - D/W Legrand Como. Jackquline Denmark, MD 06/19/2019 3:36:36 PM This report has been signed electronically.

## 2019-06-19 NOTE — Progress Notes (Signed)
Pt's states no medical or surgical changes since previsit or office visit.  Temp JB Vitals CW 

## 2019-06-21 ENCOUNTER — Telehealth: Payer: Self-pay

## 2019-06-21 NOTE — Telephone Encounter (Signed)
First post procedure follow up call, no answer 

## 2019-06-21 NOTE — Telephone Encounter (Signed)
  Follow up Call-  Call back number 06/19/2019  Post procedure Call Back phone  # [phone]  Permission to leave phone message Yes  Some recent data might be hidden     Patient questions:  Do you have a fever, pain , or abdominal swelling? No. Pain Score  0 *  Have you tolerated food without any problems? Yes.    Have you been able to return to your normal activities? Yes.    Do you have any questions about your discharge instructions: Diet   No. Medications  No. Follow up visit  No.  Do you have questions or concerns about your Care? No.  Actions: * If pain score is 4 or above: No action needed, pain <4.  1. Have you developed a fever since your procedure? no  2.   Have you had an respiratory symptoms (SOB or cough) since your procedure? no  3.   Have you tested positive for COVID 19 since your procedure no  4.   Have you had any family members/close contacts diagnosed with the COVID 19 since your procedure?  no   If yes to any of these questions please route to Joylene John, RN and Alphonsa Gin, Therapist, sports.

## 2019-06-23 ENCOUNTER — Encounter: Payer: Self-pay | Admitting: Gastroenterology

## 2019-09-27 ENCOUNTER — Other Ambulatory Visit: Payer: Self-pay | Admitting: Cardiology

## 2019-09-28 ENCOUNTER — Other Ambulatory Visit: Payer: Self-pay | Admitting: Cardiology

## 2019-09-28 ENCOUNTER — Telehealth: Payer: Self-pay | Admitting: Cardiology

## 2019-09-28 NOTE — Telephone Encounter (Signed)
New Message  Patient c/o Palpitations:  High priority if patient c/o lightheadedness, shortness of breath, or chest pain  1) How long have you had palpitations/irregular HR/ Afib? Are you having the symptoms now? No  2) Are you currently experiencing lightheadedness, SOB or CP?  Chest pain, SOB  3) Do you have a history of afib (atrial fibrillation) or irregular heart rhythm? Yes  4) Have you checked your BP or HR? (document readings if available): 120/75 HR 67-88  5) Are you experiencing any other symptoms? Palpatations, SOB, lightheadedness, no symptoms now.

## 2019-09-28 NOTE — Telephone Encounter (Signed)
Spoke to the patient just now. She let me know that she has been experiencing these palpitations, SOB, and lightheadedness for the past several months. She states that she also has some chest pain with this but she does not think it is anything to be worried about because she states that she does not feel any pressure and it is just a pinch that only lasts a second. Her BP several days ago when she checked it was 120/75 and her HR was 67. Today her BP is 141/73 HR is 83.  She states that her medications are correct and she has been taking them all as prescribed. She has been taking her nitroglycerin during these episodes but they are so short that she is unsure if they help or not.   I will route to Dr. Bettina Gavia for further advise.

## 2019-09-28 NOTE — Telephone Encounter (Signed)
Left patient a detailed message and let her know that it appears that this prescription was called in for her yesterday. I also gave our call back number in case she had any issues or concerns.

## 2019-09-28 NOTE — Telephone Encounter (Signed)
Plan to work her into the office tomorrow do a quick EKG make a decision

## 2019-09-28 NOTE — Telephone Encounter (Signed)
New Message   *STAT* If patient is at the pharmacy, call can be transferred to refill team.   1. Which medications need to be refilled? (please list name of each medication and dose if known) clopidogrel (PLAVIX) 75 MG tablet  2. Which pharmacy/location (including street and city if local pharmacy) is medication to be sent to? [id number] - [place], [place] - [address] AT [address]  3. Do they need a 30 day or 90 day supply? 90 day

## 2019-09-29 NOTE — Telephone Encounter (Signed)
Left message on patients voicemail to please return our call.   

## 2019-09-29 NOTE — Telephone Encounter (Signed)
Spoke to the patient just now and got her scheduled to see Dr. Bettina Gavia on 10/03/19. She verbalizes understanding and does not have any other issues or concerns at this time.

## 2019-10-02 NOTE — Progress Notes (Signed)
Cardiology Office Note:    Date:  10/03/2019   ID:  Rose [name], DOB 04/17/1965, MRN [id number]  PCP:  Cher Nakai, MD  Cardiologist:  Shirlee More, MD    Referring MD: Cher Nakai, MD    ASSESSMENT:    1. Palpitations   2. Hypertension, benign   3. Pure hypercholesterolemia   4. Coronary artery disease involving native coronary artery of native heart with angina pectoris ([place])    PLAN:    In order of problems listed above:  1. Symptoms have resolved at this time she has a hold on event monitor will contact me if it flares and will mailed to her home 2. Stable BP at target currently not on antihypertensive 3. Stable continue her statin will check lipid profile and CMP today 4. Atypical symptoms continue medical therapy and check myocardial perfusion study in our office   Next appointment: 3 months   Medication Adjustments/Labs and Tests Ordered: Current medicines are reviewed at length with the patient today.  Concerns regarding medicines are outlined above.  Orders Placed This Encounter  Procedures  . EKG 12-Lead   No orders of the defined types were placed in this encounter.   Chief Complaint  Patient presents with  . Palpitations    She phoned the office and is worked into the schedule today    History of Present Illness:    Rose [name] is a 54 y.o. female with a hx of CAD with PCI and stent proximal LAD 06/30/2017.  Other medical problems include non-Hodgkin's lymphoma hypertension last seen 02/15/2019.  She is worked into the office today with complaints of palpitation. Compliance with diet, lifestyle and medications: Yes  In general she is done well but lately has been apprehensive.  She is followed up with her oncologist and her disease is stable on serial imaging.  She showed me a report of a CT scan with coronary calcification I told her this is common in individuals with CAD.  She has had a few episodes of atypical rest chest pain that has been  relieved with nitroglycerin and recently was bothered with palpitation her heartbeat seemed to be forceful and rapid but it is resolved.  She is not having typical angina or shortness of breath.  We discussed further evaluation she prefers to hold on ambulatory heart rhythm monitor at the symptoms flare again we will mail it to her she is in [place] for her to go ahead and set up a myocardial perfusion study in view of her high risk with her malignancy and known CAD and PCI and stent March 2019.  She is compliant with her dual antiplatelet therapy and has had no bleeding complication as well as her lipid-lowering treatment tolerating statin  Left heart cath 06/30/2017: Conclusion   Mid RCA lesion is 60% stenosed.  Prox Cx lesion is 20% stenosed.  Prox LAD-1 lesion is 80% stenosed.  Prox LAD-2 lesion is 50% stenosed.  A drug-eluting stent was successfully placed using a STENT RESOLUTE ONYX 2.5X18.  Post intervention, there is a 0% residual stenosis.  Post intervention, there is a 0% residual stenosis.  The left ventricular systolic function is normal.  LV end diastolic pressure is normal.  The left ventricular ejection fraction is 55-65% by visual estimate.  There is no mitral valve regurgitation.  1. Severe stenosis proximal LAD.  2. Successful PTCA/DES x 1 proximal LAD 3. Mild non-obstructive disease in the proximal Circumflex 4. Moderate stenosis in the small  caliber, mid RCA 5. Normal LV function    Past Medical History:  Diagnosis Date  . Adult BMI <19 kg/sq m 06/22/2017  . ALLERGIC RHINITIS 11/05/2009   Qualifier: Diagnosis of  By: [name]    . Anxiety 06/22/2017  . Arteritis ([place]) 06/22/2017  . Arteritis ([place])   . Chronic bronchitis ([place])   . Chronic idiopathic constipation 09/05/2012  . Chronic nausea 06/22/2017  . CHRONIC OBSTRUCTIVE PULMONARY DISEASE, ACUTE EXACERBATION 10/17/2009   Qualifier: Diagnosis of  By: [name]    . COMMON  MIGRAINE 08/17/2008   Qualifier: Diagnosis of  By: [name]    . COPD 05/24/2007   Qualifier: Diagnosis of  By: [name]    . COPD GOLD II 05/24/2007   Refuses any inhalers with black box warning of sudden cardiac death - spirometry 2013/07/14 FEV1  2.07 ( 74%) ratio 69 so GOLD II barely but still smoking     . Coronary artery disease   . Cough 06/10/2013   Followed in Pulmonary clinic/ [facility]/ [place]    . DEPRESSION 05/24/2007   Qualifier: Diagnosis of  By: [name]    . Depression 05/24/2007  . Dysuria 01/06/2010   Qualifier: Diagnosis of  By: [name]    . FATIGUE 01/06/2010   Qualifier: Diagnosis of  By: [name]    . GERD without esophagitis 06/22/2017  . Hemoptysis 06/10/2013   Followed in Pulmonary clinic/ [facility]/ [place]    . History of hiatal hernia   . History of kidney stones   . Hyperlipidemia LDL goal < 160 2007-07-15   [place] Risk 3% Dec 2013    . Hypertension, benign 04/18/2007  . Impaired fasting glucose 09/17/2009   Qualifier: Diagnosis of  By: Birdie Riddle MD, Belenda Cruise    . Migraine    "take RX qd; still have ~ 1/wk" (06/30/2017)  . Non-Hodgkin lymphoma ([place]) dx'd 03/2014  . OSA on CPAP   . OSA on CPAP 02/26/2014   CPAP titration to 13 CWP, AHI 0 per hour. She wore an extra small ResMed AirFit F-10 fullface mask with heated humidifier   . Other insomnia 06/22/2017  . PANIC DISORDER 04/18/2007   Qualifier: Diagnosis of  By: Ronnald Ramp MD, Lauren    . Polymyalgia rheumatica ([place]) 12/02/2012   Taper begun November 2014,  7.5 --> 5mg  August 2015   . Shingles (herpes zoster) polyneuropathy 06/22/2017  . Sleep apnea 05/24/2007   Qualifier: Diagnosis of  By: [name]    . SMOKER 04/18/2007   Qualifier: Diagnosis of  By: Ronnald Ramp MD, Lauren    . THYROMEGALY 08/17/2007   Qualifier: Diagnosis of  By: [name]      Past Surgical History:  Procedure Laterality Date  . APPENDECTOMY  03/2017  . AUGMENTATION MAMMAPLASTY    .  bladder tuck surgery    . COLONOSCOPY  03/04/2016   Colonic polyps status post polypectomy. Small internal hemrrhoids. Monir degree of rectal prolapse  . COLONOSCOPY  06/19/2019  . CORONARY ANGIOPLASTY WITH STENT PLACEMENT  06/30/2017  . CORONARY STENT INTERVENTION N/A 06/30/2017   Procedure: CORONARY STENT INTERVENTION;  Surgeon: Burnell Blanks, MD;  Location: [facility];  Service: Cardiovascular;  Laterality: N/A;  . DILATION AND CURETTAGE OF UTERUS    . ELBOW FRACTURE SURGERY Left   . ESOPHAGOGASTRODUODENOSCOPY     Dr Dennison Nancy  . FRACTURE SURGERY    . INCONTINENCE SURGERY  2004  .  laparoscopy and mesenteric and omental biopsies  03/16/2014   Dr Pauletta Browns  . LEFT HEART CATH AND CORONARY ANGIOGRAPHY N/A 06/30/2017   Procedure: LEFT HEART CATH AND CORONARY ANGIOGRAPHY;  Surgeon: Burnell Blanks, MD;  Location: [facility];  Service: Cardiovascular;  Laterality: N/A;  . MOHS SURGERY    . PLACEMENT OF BREAST IMPLANTS  1998  . TUBAL LIGATION  1988  . UPPER GASTROINTESTINAL ENDOSCOPY  2021   also had one previously  . VAGINAL HYSTERECTOMY  2004    Current Medications: Current Meds  Medication Sig  . albuterol (PROVENTIL HFA;VENTOLIN HFA) 108 (90 BASE) MCG/ACT inhaler Inhale two puffs every 4-6 hours only as needed for shortness of breath or wheezing.  Marland Kitchen albuterol (PROVENTIL) (2.5 MG/3ML) 0.083% nebulizer solution Take 3 mLs (2.5 mg total) by nebulization every 6 (six) hours as needed for wheezing.  Marland Kitchen ALPRAZolam (XANAX) 0.5 MG tablet Take 0.5 mg by mouth 4 (four) times daily as needed.  . AMBULATORY NON FORMULARY MEDICATION Cpap titration and mask fitting Dx sleep apnea  . aspirin EC 81 MG tablet Take 81 mg by mouth daily.  Marland Kitchen atorvastatin (LIPITOR) 40 MG tablet Take 1 tablet (40 mg total) by mouth daily at 6 PM.  . buPROPion (WELLBUTRIN SR) 150 MG 12 hr tablet Take 150 mg by mouth once daily  . Carboxymethylcellul-Glycerin (REFRESH OPTIVE OP) Place 2  drops into both eyes daily.  . clopidogrel (PLAVIX) 75 MG tablet TAKE 1 TABLET(75 MG) BY MOUTH DAILY WITH BREAKFAST  . LINZESS 145 MCG CAPS capsule Take 145 mcg by mouth daily as needed.  . nitroGLYCERIN (NITROSTAT) 0.4 MG SL tablet Place 1 tablet (0.4 mg total) under the tongue every 5 (five) minutes as needed.  . pantoprazole (PROTONIX) 40 MG tablet Take 1 tablet (40 mg total) by mouth daily.  . predniSONE (DELTASONE) 5 MG tablet Take 5 mg by mouth daily.   Marland Kitchen SPIRIVA RESPIMAT 2.5 MCG/ACT AERS Take 1 puff by mouth daily.  . TRINTELLIX 10 MG TABS tablet Take 10 mg by mouth daily.  . valACYclovir (VALTREX) 1000 MG tablet Take 1,000 mg by mouth 2 (two) times daily.  Marland Kitchen zolpidem (AMBIEN) 10 MG tablet Take 10 mg by mouth at bedtime as needed.   Current Facility-Administered Medications for the 10/03/19 encounter (Office Visit) with Richardo Priest, MD  Medication  . 0.9 %  sodium chloride infusion     Allergies:   Naproxen   Social History   Socioeconomic History  . Marital status: Married    Spouse name: Not on file  . Number of children: Not on file  . Years of education: Not on file  . Highest education level: Not on file  Occupational History  . Occupation: Agricultural consultant: [employer]  Tobacco Use  . Smoking status: Current Every Day Smoker    Packs/day: 0.50    Years: 41.00    Pack years: 20.50    Types: Cigarettes  . Smokeless tobacco: Never Used  . Tobacco comment: using vape. Does 5 cigs a day   Vaping Use  . Vaping Use: Some days  . Substances: Nicotine  Substance and Sexual Activity  . Alcohol use: Not Currently  . Drug use: Never  . Sexual activity: Not on file  Other Topics Concern  . Not on file  Social History Narrative  . Not on file   Social Determinants of Health   Financial Resource Strain:   . Difficulty of Paying Living  Expenses:   Food Insecurity:   . Worried About Charity fundraiser in the Last Year:   . Arboriculturist in the Last  Year:   Transportation Needs:   . Film/video editor (Medical):   Marland Kitchen Lack of Transportation (Non-Medical):   Physical Activity:   . Days of Exercise per Week:   . Minutes of Exercise per Session:   Stress:   . Feeling of Stress :   Social Connections:   . Frequency of Communication with Friends and Family:   . Frequency of Social Gatherings with Friends and Family:   . Attends Religious Services:   . Active Member of Clubs or Organizations:   . Attends Archivist Meetings:   Marland Kitchen Marital Status:      Family History: The patient's family history includes Anemia in her mother; Breast cancer in her mother; Colon polyps in her maternal grandmother; Heart attack in her brother, father, maternal grandfather, paternal grandfather, and sister; Heart disease in her brother, father, maternal grandfather, paternal grandfather, and sister; Hyperlipidemia in her brother, father, maternal grandfather, maternal grandmother, mother, paternal grandfather, paternal grandmother, and sister; Hypertension in her brother, father, maternal grandfather, maternal grandmother, mother, paternal grandfather, paternal grandmother, and sister; Prostate cancer in her brother and father; Skin cancer in her father; Stomach cancer in her paternal grandmother; Stroke in her maternal grandfather. There is no history of Colon cancer, Esophageal cancer, or Rectal cancer. ROS:   Please see the history of present illness.    All other systems reviewed and are negative.  EKGs/Labs/Other Studies Reviewed:    The following studies were reviewed today:  EKG:  EKG ordered today and personally reviewed.  The ekg ordered today demonstrates sinus rhythm and is normal  Recent Labs: Recent CBC was normal hemoglobin 11.9 CMP showed normal creatinine GFR 57 cc liver function test normal potassium 4.4 Recent Lipid Panel    Component Value Date/Time   CHOL 203 (H) 03/22/2012 1038   TRIG 162 (H) 03/22/2012 1038   HDL 57  03/22/2012 1038   CHOLHDL 3.6 03/22/2012 1038   VLDL 32 03/22/2012 1038   LDLCALC 114 (H) 03/22/2012 1038    Physical Exam:    VS:  BP 120/72 (BP Location: Right Arm, Patient Position: Sitting, Cuff Size: Normal)   Pulse 70   Ht 5\' 5"  (1.651 m)   Wt 141 lb (64 kg)   SpO2 97%   BMI 23.46 kg/m     Wt Readings from Last 3 Encounters:  10/03/19 141 lb (64 kg)  06/19/19 137 lb (62.1 kg)  06/08/19 137 lb 8 oz (62.4 kg)     GEN:  Well nourished, well developed in no acute distress HEENT: Normal NECK: No JVD; No carotid bruits LYMPHATICS: No lymphadenopathy CARDIAC: RRR, no murmurs, rubs, gallops RESPIRATORY:  Clear to auscultation without rales, wheezing or rhonchi  ABDOMEN: Soft, non-tender, non-distended MUSCULOSKELETAL:  No edema; No deformity  SKIN: Warm and dry NEUROLOGIC:  Alert and oriented x 3 PSYCHIATRIC:  Normal affect    Signed, Shirlee More, MD  10/03/2019 1:47 PM    [facility]

## 2019-10-03 ENCOUNTER — Other Ambulatory Visit: Payer: Self-pay

## 2019-10-03 ENCOUNTER — Ambulatory Visit: Payer: BC Managed Care – PPO | Admitting: Cardiology

## 2019-10-03 ENCOUNTER — Encounter: Payer: Self-pay | Admitting: Cardiology

## 2019-10-03 VITALS — BP 120/72 | HR 70 | Ht 65.0 in | Wt 141.0 lb

## 2019-10-03 DIAGNOSIS — R002 Palpitations: Secondary | ICD-10-CM | POA: Diagnosis not present

## 2019-10-03 DIAGNOSIS — E7800 Pure hypercholesterolemia, unspecified: Secondary | ICD-10-CM

## 2019-10-03 DIAGNOSIS — I25119 Atherosclerotic heart disease of native coronary artery with unspecified angina pectoris: Secondary | ICD-10-CM

## 2019-10-03 DIAGNOSIS — I10 Essential (primary) hypertension: Secondary | ICD-10-CM

## 2019-10-03 NOTE — Patient Instructions (Signed)
Medication Instructions:  Your physician recommends that you continue on your current medications as directed. Please refer to the Current Medication list given to you today.  *If you need a refill on your cardiac medications before your next appointment, please call your pharmacy*   Lab Work: Your physician recommends that you return for lab work in: [place] If you have labs (blood work) drawn today and your tests are completely normal, you will receive your results only by:  [place] (if you have MyChart) OR  A paper copy in the mail If you have any lab test that is abnormal or we need to change your treatment, we will call you to review the results.   Testing/Procedures:   [facility] [address] [place], [place] [address] Phone:  [phone]    Please arrive 15 minutes prior to your appointment time for registration and insurance purposes.  The test will take approximately 3 to 4 hours to complete; you may bring reading material.  If someone comes with you to your appointment, they will need to remain in the main lobby due to limited space in the testing area. **If you are pregnant or breastfeeding, please notify the nuclear lab prior to your appointment**  How to prepare for your Myocardial Perfusion Test:  Do not eat or drink 3 hours prior to your test, except you may have water.  Do not consume products containing caffeine (regular or decaffeinated) 12 hours prior to your test. (ex: coffee, chocolate, sodas, tea).  Do bring a list of your current medications with you.  If not listed below, you may take your medications as normal.  Do wear comfortable clothes (no dresses or overalls) and walking shoes, tennis shoes preferred (No heels or open toe shoes are allowed).  Do NOT wear cologne, perfume, aftershave, or lotions (deodorant is allowed).  If these instructions are not followed, your test will have to be  rescheduled.  Please report to [address] for your test.  If you have questions or concerns about your appointment, you can call the [facility] at [phone].  If you cannot keep your appointment, please provide 24 hours notification to the Nuclear Lab, to avoid a possible $50 charge to your account.    Follow-Up: At [facility], you and your health needs are our priority.  As part of our continuing mission to provide you with exceptional heart care, we have created designated Provider Care Teams.  These Care Teams include your primary Cardiologist (physician) and Advanced Practice Providers (APPs -  Physician Assistants and Nurse Practitioners) who all work together to provide you with the care you need, when you need it.  We recommend signing up for the patient portal called "MyChart".  Sign up information is provided on this After Visit Summary.  MyChart is used to connect with patients for Virtual Visits (Telemedicine).  Patients are able to view lab/test results, encounter notes, upcoming appointments, etc.  Non-urgent messages can be sent to your provider as well.   To learn more about what you can do with MyChart, go to [email or web address].    Your next appointment:   3 month(s)  The format for your next appointment:   In Person  Provider:   Shirlee More, MD   Other Instructions

## 2019-10-03 NOTE — Addendum Note (Signed)
Addended by: Resa Miner I on: 10/03/2019 02:52 PM   Modules accepted: Orders

## 2019-10-04 ENCOUNTER — Telehealth: Payer: Self-pay

## 2019-10-04 ENCOUNTER — Telehealth (HOSPITAL_COMMUNITY): Payer: Self-pay | Admitting: *Deleted

## 2019-10-04 DIAGNOSIS — E7800 Pure hypercholesterolemia, unspecified: Secondary | ICD-10-CM

## 2019-10-04 LAB — LIPID PANEL
Chol/HDL Ratio: 2.5 ratio (ref 0.0–4.4)
Cholesterol, Total: 185 mg/dL (ref 100–199)
HDL: 74 mg/dL (ref >39)
LDL Chol Calc (NIH): 90 mg/dL (ref 0–99)
Triglycerides: 118 mg/dL (ref 0–149)
VLDL Cholesterol Cal: 21 mg/dL (ref 5–40)

## 2019-10-04 MED ORDER — EZETIMIBE 10 MG PO TABS
10.0000 mg | ORAL_TABLET | Freq: Every day | ORAL | 3 refills | Status: DC
Start: 2019-10-04 — End: 2020-10-16

## 2019-10-04 NOTE — Telephone Encounter (Signed)
Left message on patients voicemail to please return our call.   

## 2019-10-04 NOTE — Telephone Encounter (Signed)
Patient given detailed instructions per Myocardial Perfusion Study Information Sheet for the test on 10/11/19 at 11:00. Patient notified to arrive 15 minutes early and that it is imperative to arrive on time for appointment to keep from having the test rescheduled.  If you need to cancel or reschedule your appointment, please call the office within 24 hours of your appointment. . Patient verbalized understanding.Rose [name]

## 2019-10-04 NOTE — Telephone Encounter (Signed)
Spoke with patient regarding results and recommendation.  Patient verbalizes understanding and is agreeable to plan of care. Advised patient to call back with any issues or concerns.  

## 2019-10-11 ENCOUNTER — Ambulatory Visit (INDEPENDENT_AMBULATORY_CARE_PROVIDER_SITE_OTHER): Payer: BC Managed Care – PPO

## 2019-10-11 ENCOUNTER — Other Ambulatory Visit: Payer: Self-pay

## 2019-10-11 DIAGNOSIS — I10 Essential (primary) hypertension: Secondary | ICD-10-CM

## 2019-10-11 DIAGNOSIS — I25119 Atherosclerotic heart disease of native coronary artery with unspecified angina pectoris: Secondary | ICD-10-CM

## 2019-10-11 DIAGNOSIS — E7800 Pure hypercholesterolemia, unspecified: Secondary | ICD-10-CM | POA: Diagnosis not present

## 2019-10-11 DIAGNOSIS — R002 Palpitations: Secondary | ICD-10-CM | POA: Diagnosis not present

## 2019-10-11 LAB — MYOCARDIAL PERFUSION IMAGING
LV dias vol: 60 mL (ref 46–106)
LV sys vol: 20 mL
Peak HR: 98 {beats}/min
Rest HR: 63 {beats}/min
SDS: 5
SRS: 0
SSS: 5
TID: 1.04

## 2019-10-11 MED ORDER — TECHNETIUM TC 99M TETROFOSMIN IV KIT
31.0000 | PACK | Freq: Once | INTRAVENOUS | Status: AC | PRN
  Administered 2019-10-11: 31 via INTRAVENOUS

## 2019-10-11 MED ORDER — REGADENOSON 0.4 MG/5ML IV SOLN
0.4000 mg | Freq: Once | INTRAVENOUS | Status: AC
  Administered 2019-10-11: 0.4 mg via INTRAVENOUS

## 2019-10-11 MED ORDER — TECHNETIUM TC 99M TETROFOSMIN IV KIT
10.8000 | PACK | Freq: Once | INTRAVENOUS | Status: AC | PRN
Start: 2019-10-11 — End: 2019-10-11
  Administered 2019-10-11: 10.8 via INTRAVENOUS

## 2019-10-31 ENCOUNTER — Encounter: Payer: Self-pay | Admitting: Cardiology

## 2019-10-31 ENCOUNTER — Ambulatory Visit: Payer: BC Managed Care – PPO | Admitting: Cardiology

## 2019-10-31 ENCOUNTER — Other Ambulatory Visit: Payer: Self-pay

## 2019-10-31 VITALS — BP 146/78 | HR 75 | Ht 65.0 in | Wt 143.0 lb

## 2019-10-31 DIAGNOSIS — I10 Essential (primary) hypertension: Secondary | ICD-10-CM | POA: Diagnosis not present

## 2019-10-31 DIAGNOSIS — I25119 Atherosclerotic heart disease of native coronary artery with unspecified angina pectoris: Secondary | ICD-10-CM

## 2019-10-31 DIAGNOSIS — J449 Chronic obstructive pulmonary disease, unspecified: Secondary | ICD-10-CM

## 2019-10-31 DIAGNOSIS — E7800 Pure hypercholesterolemia, unspecified: Secondary | ICD-10-CM

## 2019-10-31 MED ORDER — ISOSORBIDE MONONITRATE ER 30 MG PO TB24
30.0000 mg | ORAL_TABLET | Freq: Every day | ORAL | 3 refills | Status: DC
Start: 2019-10-31 — End: 2020-02-16

## 2019-10-31 MED ORDER — NITROGLYCERIN 0.4 MG SL SUBL: 0.4000 mg | SUBLINGUAL_TABLET | SUBLINGUAL | 2 refills | Status: DC | PRN

## 2019-10-31 NOTE — Progress Notes (Signed)
Cardiology Office Note:    Date:  10/31/2019   ID:  [name], DOB October 04, 1965, MRN [id number]  PCP:  Cher Nakai, MD  Cardiologist:  Shirlee More, MD    Referring MD: Cher Nakai, MD    ASSESSMENT:    1. Coronary artery disease involving native coronary artery of native heart with angina pectoris ([place])   2. Hypertension, benign   3. Pure hypercholesterolemia   4. COPD GOLD II    PLAN:    In order of problems listed above:  1. She had an episode of exertional angina last week add oral nitrates continue dual antiplatelet and high intensity statin.  Reassess 4 to 6 weeks 2. \BP at target continue current treatment 3. Continue statin lipids are ideal 4. \continue bronchodilators   Next appointment: 4 to 6 weeks   Medication Adjustments/Labs and Tests Ordered: Current medicines are reviewed at length with the patient today.  Concerns regarding medicines are outlined above.  No orders of the defined types were placed in this encounter.  No orders of the defined types were placed in this encounter.   Chief Complaint  Patient presents with  . Chest Pain    She had a nuclear perfusion study 10/11/2019 showing no ischemia and normal ejection fraction    History of Present Illness:    Rose [name] is a 54 y.o. female with a hx of CAD with PCI and stent proximal LAD 06/30/2017.  Other medical problems include non-Hodgkin's lymphoma and hypertension.   She was last seen 10/03/2019.  She underwent myocardial perfusion study 10/11/2019 showing no findings of ischemia and normal left ventricular function EF 66% it was judged to be a low risk test.  I personally reviewed the images and agree there is no evidence of ischemia she has mild anterior attenuation.  Compliance with diet, lifestyle and medications:   I reviewed her testing including independently reviewing her perfusion study during this visit.  She is finds it difficult in this hot humid poor air quality time  with her COPD walked a distance coming into the grocery store the other day and had typical angina substernal chest pain rating to the back and shoulders and she is relieved with rest.  She did not take nitroglycerin.  We reviewed the results of her myocardial perfusion study normal I gave her the option of coronary angiography with ongoing symptoms she is hesitant we will place her on oral nitrates in order to intensify medical therapy and see her back in 4 to 6 weeks if she remains symptomatic I will advise coronary angiography and she said she will accept.  She is on a good medical regimen including dual antiplatelet high intensity statin and she will initiate oral nitrates.  With COPD I am not can place her on a beta-blocker.  Left heart cath 06/30/2017: Conclusion  Mid RCA lesion is 60% stenosed.  Prox Cx lesion is 20% stenosed.  Prox LAD-1 lesion is 80% stenosed.  Prox LAD-2 lesion is 50% stenosed.  A drug-eluting stent was successfully placed using a STENT RESOLUTE ONYX 2.5X18.  Post intervention, there is a 0% residual stenosis.  Post intervention, there is a 0% residual stenosis.  The left ventricular systolic function is normal.  LV end diastolic pressure is normal.  The left ventricular ejection fraction is 55-65% by visual estimate.  There is no mitral valve regurgitation. 1. Severe stenosis proximal LAD.  2. Successful PTCA/DES x 1 proximal LAD 3. Mild non-obstructive disease in the proximal  Circumflex 4. Moderate stenosis in the small caliber, mid RCA 5. Normal LV function  Past Medical History:  Diagnosis Date  . Adult BMI <19 kg/sq m 06/22/2017  . ALLERGIC RHINITIS 11/05/2009   Qualifier: Diagnosis of  By: [name]    . Anxiety 06/22/2017  . Arteritis ([place]) 06/22/2017  . Arteritis ([place])   . Chronic bronchitis ([place])   . Chronic idiopathic constipation 09/05/2012  . Chronic nausea 06/22/2017  . CHRONIC OBSTRUCTIVE PULMONARY DISEASE, ACUTE EXACERBATION 10/17/2009    Qualifier: Diagnosis of  By: [name]    . COMMON MIGRAINE 08/17/2008   Qualifier: Diagnosis of  By: [name]    . COPD 05/24/2007   Qualifier: Diagnosis of  By: [name]    . COPD GOLD II 05/24/2007   Refuses any inhalers with black box warning of sudden cardiac death - spirometry 2013/07/12 FEV1  2.07 ( 74%) ratio 69 so GOLD II barely but still smoking     . Coronary artery disease   . Cough 06/10/2013   Followed in Pulmonary clinic/ [facility]/ [place]    . DEPRESSION 05/24/2007   Qualifier: Diagnosis of  By: [name]    . Depression 05/24/2007  . Dysuria 01/06/2010   Qualifier: Diagnosis of  By: [name]    . FATIGUE 01/06/2010   Qualifier: Diagnosis of  By: [name]    . GERD without esophagitis 06/22/2017  . Hemoptysis 06/10/2013   Followed in Pulmonary clinic/ [facility]/ [place]    . History of hiatal hernia   . History of kidney stones   . Hyperlipidemia LDL goal < 160 07/13/07   [place] Risk 3% Dec 2013    . Hypertension, benign 04/18/2007  . Impaired fasting glucose 09/17/2009   Qualifier: Diagnosis of  By: Birdie Riddle MD, Belenda Cruise    . Migraine    "take RX qd; still have ~ 1/wk" (06/30/2017)  . Non-Hodgkin lymphoma ([place]) dx'd 03/2014  . OSA on CPAP   . OSA on CPAP 02/26/2014   CPAP titration to 13 CWP, AHI 0 per hour. She wore an extra small ResMed AirFit F-10 fullface mask with heated humidifier   . Other insomnia 06/22/2017  . PANIC DISORDER 04/18/2007   Qualifier: Diagnosis of  By: Ronnald Ramp MD, Lauren    . Polymyalgia rheumatica ([place]) 12/02/2012   Taper begun November 2014,  7.5 --> 5mg  August 2015   . Shingles (herpes zoster) polyneuropathy 06/22/2017  . Sleep apnea 05/24/2007   Qualifier: Diagnosis of  By: [name]    . SMOKER 04/18/2007   Qualifier: Diagnosis of  By: Ronnald Ramp MD, Lauren    . THYROMEGALY 08/17/2007   Qualifier: Diagnosis of  By: [name]      Past Surgical History:  Procedure Laterality Date  .  APPENDECTOMY  03/2017  . AUGMENTATION MAMMAPLASTY    . bladder tuck surgery    . COLONOSCOPY  03/04/2016   Colonic polyps status post polypectomy. Small internal hemrrhoids. Monir degree of rectal prolapse  . COLONOSCOPY  06/19/2019  . CORONARY ANGIOPLASTY WITH STENT PLACEMENT  06/30/2017  . CORONARY STENT INTERVENTION N/A 06/30/2017   Procedure: CORONARY STENT INTERVENTION;  Surgeon: Burnell Blanks, MD;  Location: [facility];  Service: Cardiovascular;  Laterality: N/A;  . DILATION AND CURETTAGE OF UTERUS    . ELBOW FRACTURE SURGERY Left   . ESOPHAGOGASTRODUODENOSCOPY     Dr Dennison Nancy  . FRACTURE SURGERY    .  INCONTINENCE SURGERY  2004  . laparoscopy and mesenteric and omental biopsies  03/16/2014   Dr Pauletta Browns  . LEFT HEART CATH AND CORONARY ANGIOGRAPHY N/A 06/30/2017   Procedure: LEFT HEART CATH AND CORONARY ANGIOGRAPHY;  Surgeon: Burnell Blanks, MD;  Location: [facility];  Service: Cardiovascular;  Laterality: N/A;  . MOHS SURGERY    . PLACEMENT OF BREAST IMPLANTS  1998  . TUBAL LIGATION  1988  . UPPER GASTROINTESTINAL ENDOSCOPY  2021   also had one previously  . VAGINAL HYSTERECTOMY  2004    Current Medications: Current Meds  Medication Sig  . albuterol (PROVENTIL HFA;VENTOLIN HFA) 108 (90 BASE) MCG/ACT inhaler Inhale two puffs every 4-6 hours only as needed for shortness of breath or wheezing.  Marland Kitchen albuterol (PROVENTIL) (2.5 MG/3ML) 0.083% nebulizer solution Take 3 mLs (2.5 mg total) by nebulization every 6 (six) hours as needed for wheezing.  Marland Kitchen ALPRAZolam (XANAX) 0.5 MG tablet Take 0.5 mg by mouth 4 (four) times daily as needed.  . AMBULATORY NON FORMULARY MEDICATION Cpap titration and mask fitting Dx sleep apnea  . aspirin EC 81 MG tablet Take 81 mg by mouth daily.  Marland Kitchen atorvastatin (LIPITOR) 40 MG tablet Take 1 tablet (40 mg total) by mouth daily at 6 PM.  . buPROPion (WELLBUTRIN SR) 150 MG 12 hr tablet Take 150 mg by mouth once daily  .  Carboxymethylcellul-Glycerin (REFRESH OPTIVE OP) Place 2 drops into both eyes daily.  . clopidogrel (PLAVIX) 75 MG tablet TAKE 1 TABLET(75 MG) BY MOUTH DAILY WITH BREAKFAST  . ezetimibe (ZETIA) 10 MG tablet Take 1 tablet (10 mg total) by mouth daily.  Marland Kitchen LINZESS 145 MCG CAPS capsule Take 145 mcg by mouth daily as needed.  . nitroGLYCERIN (NITROSTAT) 0.4 MG SL tablet Place 1 tablet (0.4 mg total) under the tongue every 5 (five) minutes as needed.  . pantoprazole (PROTONIX) 40 MG tablet Take 1 tablet (40 mg total) by mouth daily.  . predniSONE (DELTASONE) 5 MG tablet Take 5 mg by mouth daily.   Marland Kitchen SPIRIVA RESPIMAT 2.5 MCG/ACT AERS Take 1 puff by mouth daily.  . TRINTELLIX 10 MG TABS tablet Take 10 mg by mouth daily.  . valACYclovir (VALTREX) 1000 MG tablet Take 1,000 mg by mouth 2 (two) times daily.  Marland Kitchen zolpidem (AMBIEN) 10 MG tablet Take 10 mg by mouth at bedtime as needed.   Current Facility-Administered Medications for the 10/31/19 encounter (Office Visit) with Richardo Priest, MD  Medication  . 0.9 %  sodium chloride infusion     Allergies:   Naproxen   Social History   Socioeconomic History  . Marital status: Married    Spouse name: Not on file  . Number of children: Not on file  . Years of education: Not on file  . Highest education level: Not on file  Occupational History  . Occupation: Agricultural consultant: [employer]  Tobacco Use  . Smoking status: Current Every Day Smoker    Packs/day: 0.50    Years: 41.00    Pack years: 20.50    Types: Cigarettes  . Smokeless tobacco: Never Used  . Tobacco comment: using vape. Does 5 cigs a day   Vaping Use  . Vaping Use: Some days  . Substances: Nicotine  Substance and Sexual Activity  . Alcohol use: Not Currently  . Drug use: Never  . Sexual activity: Not on file  Other Topics Concern  . Not on file  Social History Narrative  .  Not on file   Social Determinants of Health   Financial Resource Strain:   . Difficulty of  Paying Living Expenses:   Food Insecurity:   . Worried About Charity fundraiser in the Last Year:   . Arboriculturist in the Last Year:   Transportation Needs:   . Film/video editor (Medical):   Marland Kitchen Lack of Transportation (Non-Medical):   Physical Activity:   . Days of Exercise per Week:   . Minutes of Exercise per Session:   Stress:   . Feeling of Stress :   Social Connections:   . Frequency of Communication with Friends and Family:   . Frequency of Social Gatherings with Friends and Family:   . Attends Religious Services:   . Active Member of Clubs or Organizations:   . Attends Archivist Meetings:   Marland Kitchen Marital Status:      Family History: The patient's family history includes Anemia in her mother; Breast cancer in her mother; Colon polyps in her maternal grandmother; Heart attack in her brother, father, maternal grandfather, paternal grandfather, and sister; Heart disease in her brother, father, maternal grandfather, paternal grandfather, and sister; Hyperlipidemia in her brother, father, maternal grandfather, maternal grandmother, mother, paternal grandfather, paternal grandmother, and sister; Hypertension in her brother, father, maternal grandfather, maternal grandmother, mother, paternal grandfather, paternal grandmother, and sister; Prostate cancer in her brother and father; Skin cancer in her father; Stomach cancer in her paternal grandmother; Stroke in her maternal grandfather. There is no history of Colon cancer, Esophageal cancer, or Rectal cancer. ROS:   Please see the history of present illness.    All other systems reviewed and are negative.  EKGs/Labs/Other Studies Reviewed:    The following studies were reviewed today:  EKG:  EKG ordered today and personally reviewed.  The ekg ordered today demonstrates sinus rhythm normal pattern unchanged from her EKG 10/03/2019  Recent Labs: 10/05/2019 cholesterol 136 LDL 48 triglycerides 73 HDL 73 lipids are at  target Recent Lipid Panel    Component Value Date/Time   CHOL 185 10/03/2019 1358   TRIG 118 10/03/2019 1358   HDL 74 10/03/2019 1358   CHOLHDL 2.5 10/03/2019 1358   CHOLHDL 3.6 03/22/2012 1038   VLDL 32 03/22/2012 1038   LDLCALC 90 10/03/2019 1358    Physical Exam:    VS:  BP (!) 146/78 (BP Location: Right Arm, Patient Position: Sitting, Cuff Size: Normal)   Pulse 75   Ht 5\' 5"  (1.651 m)   Wt 143 lb (64.9 kg)   SpO2 97%   BMI 23.80 kg/m     Wt Readings from Last 3 Encounters:  10/31/19 143 lb (64.9 kg)  10/11/19 141 lb (64 kg)  10/03/19 141 lb (64 kg)     GEN:  Well nourished, well developed in no acute distress HEENT: Normal NECK: No JVD; No carotid bruits LYMPHATICS: No lymphadenopathy CARDIAC: RRR, no murmurs, rubs, gallops RESPIRATORY:  Clear to auscultation without rales, wheezing or rhonchi  ABDOMEN: Soft, non-tender, non-distended MUSCULOSKELETAL:  No edema; No deformity  SKIN: Warm and dry NEUROLOGIC:  Alert and oriented x 3 PSYCHIATRIC:  Normal affect    Signed, Shirlee More, MD  10/31/2019 2:51 PM    [facility]

## 2019-10-31 NOTE — Patient Instructions (Signed)
Medication Instructions:  Your physician has recommended you make the following change in your medication:  START: Imdur 30 mg take one tablet by mouth daily.  *If you need a refill on your cardiac medications before your next appointment, please call your pharmacy*   Lab Work: None If you have labs (blood work) drawn today and your tests are completely normal, you will receive your results only by: Marland Kitchen MyChart Message (if you have MyChart) OR . A paper copy in the mail If you have any lab test that is abnormal or we need to change your treatment, we will call you to review the results.   Testing/Procedures: None   Follow-Up: At [facility], you and your health needs are our priority.  As part of our continuing mission to provide you with exceptional heart care, we have created designated Provider Care Teams.  These Care Teams include your primary Cardiologist (physician) and Advanced Practice Providers (APPs -  Physician Assistants and Nurse Practitioners) who all work together to provide you with the care you need, when you need it.  We recommend signing up for the patient portal called "MyChart".  Sign up information is provided on this After Visit Summary.  MyChart is used to connect with patients for Virtual Visits (Telemedicine).  Patients are able to view lab/test results, encounter notes, upcoming appointments, etc.  Non-urgent messages can be sent to your provider as well.   To learn more about what you can do with MyChart, go to [email or web address].    Your next appointment:   4-6 week(s)  The format for your next appointment:   In Person  Provider:   Shirlee More, MD   Other Instructions

## 2019-11-15 LAB — LIPID PANEL
Chol/HDL Ratio: 2.1 ratio (ref 0.0–4.4)
Cholesterol, Total: 141 mg/dL (ref 100–199)
HDL: 68 mg/dL (ref >39)
LDL Chol Calc (NIH): 56 mg/dL (ref 0–99)
Triglycerides: 88 mg/dL (ref 0–149)
VLDL Cholesterol Cal: 17 mg/dL (ref 5–40)

## 2019-11-16 ENCOUNTER — Telehealth: Payer: Self-pay

## 2019-11-16 NOTE — Telephone Encounter (Signed)
Spoke with patient regarding results and recommendation.  Patient verbalizes understanding and is agreeable to plan of care. Advised patient to call back with any issues or concerns.  

## 2019-12-04 ENCOUNTER — Other Ambulatory Visit: Payer: Self-pay

## 2019-12-04 ENCOUNTER — Ambulatory Visit: Payer: BC Managed Care – PPO | Admitting: Cardiology

## 2019-12-04 MED ORDER — CLOPIDOGREL BISULFATE 75 MG PO TABS: ORAL_TABLET | ORAL | 0 refills | Status: DC

## 2019-12-04 MED ORDER — CLOPIDOGREL BISULFATE 75 MG PO TABS: ORAL_TABLET | ORAL | 3 refills | Status: DC

## 2019-12-04 NOTE — Progress Notes (Signed)
Refill sent in per faxed request 

## 2020-01-04 ENCOUNTER — Ambulatory Visit: Payer: BC Managed Care – PPO | Admitting: Cardiology

## 2020-02-15 ENCOUNTER — Other Ambulatory Visit: Payer: Self-pay

## 2020-02-15 NOTE — Progress Notes (Signed)
Cardiology Office Note:    Date:  02/16/2020   ID:  Rose [name], DOB [birth year], MRN [id number]  PCP:  Cher Nakai, MD  Cardiologist:  Shirlee More, MD    Referring MD: Cher Nakai, MD    ASSESSMENT:    1. Coronary artery disease involving native coronary artery of native heart with angina pectoris ([place])   2. Hypertension, benign   3. Pure hypercholesterolemia   4. COPD GOLD II    PLAN:    In order of problems listed above:  1. Stable CAD, New York Heart Association class I having no angina after PCI continue dual antiplatelet therapy combined lipid-lowering with atorvastatin and Zetia.  At this time she does not require an ischemia evaluation 2. Stable BP at target presently not on antihypertensive agents 3. Lipids are ideal continue current treatment high risk with CAD 4. Stable COPD   Next appointment: 6 months   Medication Adjustments/Labs and Tests Ordered: Current medicines are reviewed at length with the patient today.  Concerns regarding medicines are outlined above.  No orders of the defined types were placed in this encounter.  No orders of the defined types were placed in this encounter.   Chief Complaint  Patient presents with  . Follow-up  . Coronary Artery Disease    History of Present Illness:    Rose [name] is a 54 y.o. female with a hx of CAD with PCI and stent proximal left anterior descending coronary artery 10/30/2017, non-Hodgkin's lymphoma hypertension dyslipidemia and COPD.  Last seen 10/31/2019. Compliance with diet, lifestyle and medications: Yes  Refers to an sponsor vaccine 1 week ago.  Encouraged to follow through with formal vaccination. COPD is stable with occasional wheezing. She tolerates dual antiplatelet therapy without GI distress or bleeding. She continues on both atorvastatin and Zetia for lipid-lowering without muscle pain or weakness. She has had no chest pain edema palpitation or syncope. EKG last visit 10/31/2019  sinus rhythm is normal She is having chronic flank pain bilaterally which seems to be neuropathic in nature perhaps related to radiation or lymphoma. Past Medical History:  Diagnosis Date  . Adult BMI <19 kg/sq m 06/22/2017  . ALLERGIC RHINITIS 11/05/2009   Qualifier: Diagnosis of  By: [name]    . Anxiety 06/22/2017  . Arteritis ([place]) 06/22/2017  . Arteritis ([place])   . Chronic bronchitis ([place])   . Chronic idiopathic constipation 09/05/2012  . Chronic nausea 06/22/2017  . CHRONIC OBSTRUCTIVE PULMONARY DISEASE, ACUTE EXACERBATION 10/17/2009   Qualifier: Diagnosis of  By: [name]    . COMMON MIGRAINE 08/17/2008   Qualifier: Diagnosis of  By: [name]    . COPD 05/24/2007   Qualifier: Diagnosis of  By: [name]    . COPD GOLD II 05/24/2007   Refuses any inhalers with black box warning of sudden cardiac death - spirometry Jul 14, 2013 FEV1  2.07 ( 74%) ratio 69 so GOLD II barely but still smoking     . Coronary artery disease   . Cough 06/10/2013   Followed in Pulmonary clinic/ [facility]/ [place]    . DEPRESSION 05/24/2007   Qualifier: Diagnosis of  By: [name]    . Depression 05/24/2007  . Dysuria 01/06/2010   Qualifier: Diagnosis of  By: [name]    . FATIGUE 01/06/2010   Qualifier: Diagnosis of  By: [name]    . GERD without esophagitis 06/22/2017  . Hemoptysis 06/10/2013   Followed in  Pulmonary clinic/ [facility]    . History of hiatal hernia   . History of kidney stones   . Hyperlipidemia LDL goal < 160 06/24/2007   F[place]ingham Risk 3% Dec 2013    . Hypertension, benign 04/18/2007  . Impaired fasting glucose 09/17/2009   Qualifier: Diagnosis of  By: Birdie Riddle MD, Belenda Cruise    . Migraine    "take RX qd; still have ~ 1/wk" (06/30/2017)  . Non-Hodgkin lymphoma ([place]) dx'd 03/2014  . OSA on CPAP   . OSA on CPAP 02/26/2014   CPAP titration to 13 CWP, AHI 0 per hour. She wore an extra small ResMed AirFit F-10 fullface mask with heated  humidifier   . Other insomnia 06/22/2017  . PANIC DISORDER 04/18/2007   Qualifier: Diagnosis of  By: Ronnald Ramp MD, Lauren    . Polymyalgia rheumatica ([place]) 12/02/2012   Taper begun November 2014,  7.5 --> 5mg  August 2015   . Shingles (herpes zoster) polyneuropathy 06/22/2017  . Sleep apnea 05/24/2007   Qualifier: Diagnosis of  By: [name]    . SMOKER 04/18/2007   Qualifier: Diagnosis of  By: Ronnald Ramp MD, Lauren    . THYROMEGALY 08/17/2007   Qualifier: Diagnosis of  By: [name]      Past Surgical History:  Procedure Laterality Date  . APPENDECTOMY  03/2017  . AUGMENTATION MAMMAPLASTY    . bladder tuck surgery    . COLONOSCOPY  03/04/2016   Colonic polyps status post polypectomy. Small internal hemrrhoids. Monir degree of rectal prolapse  . COLONOSCOPY  06/19/2019  . CORONARY ANGIOPLASTY WITH STENT PLACEMENT  06/30/2017  . CORONARY STENT INTERVENTION N/A 06/30/2017   Procedure: CORONARY STENT INTERVENTION;  Surgeon: Burnell Blanks, MD;  Location: [facility];  Service: Cardiovascular;  Laterality: N/A;  . DILATION AND CURETTAGE OF UTERUS    . ELBOW FRACTURE SURGERY Left   . ESOPHAGOGASTRODUODENOSCOPY     Dr Dennison Nancy  . FRACTURE SURGERY    . INCONTINENCE SURGERY  2004  . laparoscopy and mesenteric and omental biopsies  03/16/2014   Dr Pauletta Browns  . LEFT HEART CATH AND CORONARY ANGIOGRAPHY N/A 06/30/2017   Procedure: LEFT HEART CATH AND CORONARY ANGIOGRAPHY;  Surgeon: Burnell Blanks, MD;  Location: [facility];  Service: Cardiovascular;  Laterality: N/A;  . MOHS SURGERY    . PLACEMENT OF BREAST IMPLANTS  1998  . TUBAL LIGATION  1988  . UPPER GASTROINTESTINAL ENDOSCOPY  2021   also had one previously  . VAGINAL HYSTERECTOMY  2004    Current Medications: Current Meds  Medication Sig  . albuterol (PROVENTIL HFA;VENTOLIN HFA) 108 (90 BASE) MCG/ACT inhaler Inhale two puffs every 4-6 hours only as needed for shortness of breath or wheezing.  Marland Kitchen  albuterol (PROVENTIL) (2.5 MG/3ML) 0.083% nebulizer solution Take 3 mLs (2.5 mg total) by nebulization every 6 (six) hours as needed for wheezing.  Marland Kitchen ALPRAZolam (XANAX) 0.5 MG tablet Take 0.5 mg by mouth 4 (four) times daily as needed.  . AMBULATORY NON FORMULARY MEDICATION Cpap titration and mask fitting Dx sleep apnea  . aspirin EC 81 MG tablet Take 81 mg by mouth daily.  Marland Kitchen atorvastatin (LIPITOR) 40 MG tablet Take 1 tablet (40 mg total) by mouth daily at 6 PM.  . buPROPion (WELLBUTRIN SR) 150 MG 12 hr tablet Take 150 mg by mouth once daily  . Carboxymethylcellul-Glycerin (REFRESH OPTIVE OP) Place 2 drops into both eyes daily.  . clopidogrel (PLAVIX) 75 MG tablet  Take 75 mg tablet once a day  . ezetimibe (ZETIA) 10 MG tablet Take 1 tablet (10 mg total) by mouth daily.  Marland Kitchen LINZESS 72 MCG capsule Take 72 mcg by mouth daily as needed.  . nitroGLYCERIN (NITROSTAT) 0.4 MG SL tablet Place 1 tablet (0.4 mg total) under the tongue every 5 (five) minutes as needed.  . pantoprazole (PROTONIX) 40 MG tablet Take 1 tablet (40 mg total) by mouth daily.  . predniSONE (DELTASONE) 5 MG tablet Take 5 mg by mouth daily.   Marland Kitchen SPIRIVA RESPIMAT 2.5 MCG/ACT AERS Take 1 puff by mouth daily.  . TRINTELLIX 10 MG TABS tablet Take 10 mg by mouth daily.  . valACYclovir (VALTREX) 1000 MG tablet Take 1,000 mg by mouth 2 (two) times daily.  Marland Kitchen zolpidem (AMBIEN) 10 MG tablet Take 10 mg by mouth at bedtime as needed.   Current Facility-Administered Medications for the 02/16/20 encounter (Office Visit) with Richardo Priest, MD  Medication  . 0.9 %  sodium chloride infusion     Allergies:   Naproxen   Social History   Socioeconomic History  . Marital status: Married    Spouse name: Not on file  . Number of children: Not on file  . Years of education: Not on file  . Highest education level: Not on file  Occupational History  . Occupation: Agricultural consultant: [employer]  Tobacco Use  . Smoking status: Current  Every Day Smoker    Packs/day: 0.50    Years: 41.00    Pack years: 20.50    Types: Cigarettes  . Smokeless tobacco: Never Used  . Tobacco comment: using vape. Does 5 cigs a day   Vaping Use  . Vaping Use: Some days  . Substances: Nicotine  Substance and Sexual Activity  . Alcohol use: Not Currently  . Drug use: Never  . Sexual activity: Not on file  Other Topics Concern  . Not on file  Social History Narrative  . Not on file   Social Determinants of Health   Financial Resource Strain:   . Difficulty of Paying Living Expenses: Not on file  Food Insecurity:   . Worried About Charity fundraiser in the Last Year: Not on file  . Ran Out of Food in the Last Year: Not on file  Transportation Needs:   . Lack of Transportation (Medical): Not on file  . Lack of Transportation (Non-Medical): Not on file  Physical Activity:   . Days of Exercise per Week: Not on file  . Minutes of Exercise per Session: Not on file  Stress:   . Feeling of Stress : Not on file  Social Connections:   . Frequency of Communication with Friends and Family: Not on file  . Frequency of Social Gatherings with Friends and Family: Not on file  . Attends Religious Services: Not on file  . Active Member of Clubs or Organizations: Not on file  . Attends Archivist Meetings: Not on file  . Marital Status: Not on file     Family History: The patient's family history includes Anemia in her mother; Breast cancer in her mother; Colon polyps in her maternal grandmother; Heart attack in her brother, father, maternal grandfather, paternal grandfather, and sister; Heart disease in her brother, father, maternal grandfather, paternal grandfather, and sister; Hyperlipidemia in her brother, father, maternal grandfather, maternal grandmother, mother, paternal grandfather, paternal grandmother, and sister; Hypertension in her brother, father, maternal grandfather, maternal grandmother, mother, paternal grandfather,  paternal grandmother, and sister; Prostate cancer in her brother and father; Skin cancer in her father; Stomach cancer in her paternal grandmother; Stroke in her maternal grandfather. There is no history of Colon cancer, Esophageal cancer, or Rectal cancer. ROS:   Please see the history of present illness.    All other systems reviewed and are negative.  EKGs/Labs/Other Studies Reviewed:    The following studies were reviewed today: 11/15/2019 PCP labs: Lipids at target cholesterol 141 LDL 56 HDL 68 triglycerides 88 Creatinine normal 0.99  Recent Labs: No results found for requested labs within last 8760 hours.  Recent Lipid Panel    Component Value Date/Time   CHOL 141 11/15/2019 0851   TRIG 88 11/15/2019 0851   HDL 68 11/15/2019 0851   CHOLHDL 2.1 11/15/2019 0851   CHOLHDL 3.6 03/22/2012 1038   VLDL 32 03/22/2012 1038   LDLCALC 56 11/15/2019 0851    Physical Exam:    VS:  BP 138/76   Pulse 70   Ht 5\' 5"  (1.651 m)   Wt 149 lb 12.8 oz (67.9 kg)   SpO2 96%   BMI 24.93 kg/m     Wt Readings from Last 3 Encounters:  02/16/20 149 lb 12.8 oz (67.9 kg)  10/31/19 143 lb (64.9 kg)  10/11/19 141 lb (64 kg)     GEN:  Well nourished, well developed in no acute distress HEENT: Normal NECK: No JVD; No carotid bruits LYMPHATICS: No lymphadenopathy CARDIAC: RRR, no murmurs, rubs, gallops RESPIRATORY:  Clear to auscultation without rales, wheezing or rhonchi  ABDOMEN: Soft, non-tender, non-distended MUSCULOSKELETAL:  No edema; No deformity  SKIN: Warm and dry NEUROLOGIC:  Alert and oriented x 3 PSYCHIATRIC:  Normal affect    Signed, Shirlee More, MD  02/16/2020 1:01 PM    [facility]

## 2020-02-16 ENCOUNTER — Other Ambulatory Visit: Payer: Self-pay

## 2020-02-16 ENCOUNTER — Ambulatory Visit (INDEPENDENT_AMBULATORY_CARE_PROVIDER_SITE_OTHER): Payer: BC Managed Care – PPO | Admitting: Cardiology

## 2020-02-16 ENCOUNTER — Encounter: Payer: Self-pay | Admitting: Cardiology

## 2020-02-16 VITALS — BP 138/76 | HR 70 | Ht 65.0 in | Wt 149.8 lb

## 2020-02-16 DIAGNOSIS — I10 Essential (primary) hypertension: Secondary | ICD-10-CM | POA: Diagnosis not present

## 2020-02-16 DIAGNOSIS — J449 Chronic obstructive pulmonary disease, unspecified: Secondary | ICD-10-CM | POA: Diagnosis not present

## 2020-02-16 DIAGNOSIS — E7800 Pure hypercholesterolemia, unspecified: Secondary | ICD-10-CM

## 2020-02-16 DIAGNOSIS — I25119 Atherosclerotic heart disease of native coronary artery with unspecified angina pectoris: Secondary | ICD-10-CM | POA: Diagnosis not present

## 2020-02-16 NOTE — Patient Instructions (Signed)
Medication Instructions:  Your physician recommends that you continue on your current medications as directed. Please refer to the Current Medication list given to you today.  Please use OTC mucinex *If you need a refill on your cardiac medications before your next appointment, please call your pharmacy*   Lab Work: None If you have labs (blood work) drawn today and your tests are completely normal, you will receive your results only by: Marland Kitchen MyChart Message (if you have MyChart) OR . A paper copy in the mail If you have any lab test that is abnormal or we need to change your treatment, we will call you to review the results.   Testing/Procedures: None   Follow-Up: At [facility], you and your health needs are our priority.  As part of our continuing mission to provide you with exceptional heart care, we have created designated Provider Care Teams.  These Care Teams include your primary Cardiologist (physician) and Advanced Practice Providers (APPs -  Physician Assistants and Nurse Practitioners) who all work together to provide you with the care you need, when you need it.  We recommend signing up for the patient portal called "MyChart".  Sign up information is provided on this After Visit Summary.  MyChart is used to connect with patients for Virtual Visits (Telemedicine).  Patients are able to view lab/test results, encounter notes, upcoming appointments, etc.  Non-urgent messages can be sent to your provider as well.   To learn more about what you can do with MyChart, go to [email or web address].    Your next appointment:   6 month(s)  The format for your next appointment:   In Person  Provider:   Shirlee More, MD   Other Instructions

## 2020-07-17 ENCOUNTER — Other Ambulatory Visit: Payer: Self-pay

## 2020-07-17 MED ORDER — PANTOPRAZOLE SODIUM 40 MG PO TBEC
40.0000 mg | DELAYED_RELEASE_TABLET | Freq: Every day | ORAL | 7 refills | Status: DC
Start: 2020-07-17 — End: 2020-08-07

## 2020-07-17 NOTE — Progress Notes (Signed)
Patient made appointment for 5-4 and wanted to see if she can quit protonix and I told her that its up to the doctor if she needs to be wean off and how he wants her to wean of and I asked her is she having any symptoms and said no but she doesn't know what happens if she comes off of it because she never tried it

## 2020-08-07 ENCOUNTER — Telehealth (INDEPENDENT_AMBULATORY_CARE_PROVIDER_SITE_OTHER): Payer: BC Managed Care – PPO | Admitting: Gastroenterology

## 2020-08-07 ENCOUNTER — Other Ambulatory Visit: Payer: Self-pay

## 2020-08-07 VITALS — Ht 65.0 in | Wt 145.0 lb

## 2020-08-07 DIAGNOSIS — K589 Irritable bowel syndrome without diarrhea: Secondary | ICD-10-CM | POA: Diagnosis not present

## 2020-08-07 DIAGNOSIS — K219 Gastro-esophageal reflux disease without esophagitis: Secondary | ICD-10-CM | POA: Diagnosis not present

## 2020-08-07 MED ORDER — PANTOPRAZOLE SODIUM 40 MG PO TBEC: 40.0000 mg | DELAYED_RELEASE_TABLET | Freq: Every day | ORAL | 3 refills | Status: DC

## 2020-08-07 NOTE — Addendum Note (Signed)
Addended by: Curlene Labrum E on: 08/07/2020 12:11 PM   Modules accepted: Orders

## 2020-08-07 NOTE — Patient Instructions (Addendum)
If you are age 57 or older, your body mass index should be between 23-30. Your Body mass index is 24.13 kg/m. If this is out of the aforementioned range listed, please consider follow up with your Primary Care Provider.  If you are age 18 or younger, your body mass index should be between 19-25. Your Body mass index is 24.13 kg/m. If this is out of the aformentioned range listed, please consider follow up with your Primary Care Provider.   We have sent the following medications to your pharmacy for you to pick up at your convenience: Protonix  Thank you,  Dr. Jackquline Denmark

## 2020-08-07 NOTE — Progress Notes (Addendum)
Chief Complaint: For EGD/colon  Referring Provider:  Cher Nakai, MD      ASSESSMENT AND PLAN;   #1. GERD. Neg EGD 06/2019  #2.  IBS-C. Neg colon 06/2019  #3. 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 S/P XRT. Being followed by [facility] by regular CT chest/Abdo/pelvis.   Plan: - Linzess 72 mcg p.o. QD to continue for now. - protonix 40mg  po qd, #30, 11 refills. Can reduce it to QOD 1 month, then can use as needed.  Use the lowest effective dose. -She will continue to FU at [facility].  We will follow the recommendations in the future. -Available for any future GI needs.     HPI:    Rose [name] is a 55 y.o. 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  Recently had CT chest Abdo/pelvis which apparently showed lymph nodes, spleen 14 cm, liver is sl enlarged 06/2020 on CT at [facility]. Has FU appt in Sept 2022  For follow-up visit-telemedicine  Patient feels better.  No further heartburn on Protonix.  She would like to reduce it to every other day.  I do agree.  Constipation is much better on Linzess 72 mcg p.o. once a day.  Pleased with the progress.  Past recent GI procedures:  EGD 06/19/2019 -Minimal gastritis. -Neg gastric Bx for HP, neg SB Bx for celiac.  Colonoscopy 06/19/2019 -Very minimal sigmoid diverticulosis. -Non-bleeding external and internal hemorrhoids. -Otherwise normal colonoscopy to TI. The colon was highly redundant. -Rpt in 24yrs.   Past Medical History:  Diagnosis Date  . Adult BMI <19 kg/sq m 06/22/2017  . ALLERGIC RHINITIS 11/05/2009   Qualifier: Diagnosis of  By: [name]    . Anxiety 06/22/2017  . Arteritis ([place]) 06/22/2017  . Arteritis ([place])   . Chronic bronchitis ([place])   . Chronic idiopathic constipation 09/05/2012  . Chronic nausea 06/22/2017  . CHRONIC OBSTRUCTIVE PULMONARY DISEASE, ACUTE EXACERBATION 10/17/2009    Qualifier: Diagnosis of  By: [name]    . COMMON MIGRAINE 08/17/2008   Qualifier: Diagnosis of  By: [name]    . COPD 05/24/2007   Qualifier: Diagnosis of  By: [name]    . COPD GOLD II 05/24/2007   Refuses any inhalers with black box warning of sudden cardiac death - spirometry Jul 03, 2013 FEV1  2.07 ( 74%) ratio 69 so GOLD II barely but still smoking     . Coronary artery disease   . Cough 06/10/2013   Followed in Pulmonary clinic/ [facility]/ [facility]    . DEPRESSION 05/24/2007   Qualifier: Diagnosis of  By: [name]    . Depression 05/24/2007  . Dysuria 01/06/2010   Qualifier: Diagnosis of  By: [name]    . FATIGUE 01/06/2010   Qualifier: Diagnosis of  By: [name]    . GERD without esophagitis 06/22/2017  . Hemoptysis 06/10/2013   Followed in Pulmonary clinic/ [facility]/ [facility]    . History of hiatal hernia   . History of kidney stones   . Hyperlipidemia LDL goal < 160 07-04-07   [place] Risk 3% Dec 2013    . Hypertension, benign 04/18/2007  . Impaired fasting glucose 09/17/2009   Qualifier: Diagnosis of  By: Birdie Riddle MD, Belenda Cruise    . Migraine    "take RX qd; still have ~ 1/wk" (06/30/2017)  .  Non-Hodgkin lymphoma ([place]) dx'd 03/2014  . OSA on CPAP   . OSA on CPAP 02/26/2014   CPAP titration to 13 CWP, AHI 0 per hour. She wore an extra small ResMed AirFit F-10 fullface mask with heated humidifier   . Other insomnia 06/22/2017  . PANIC DISORDER 04/18/2007   Qualifier: Diagnosis of  By: Ronnald Ramp MD, Lauren    . Polymyalgia rheumatica ([place]) 12/02/2012   Taper begun November 2014,  7.5 --> 5mg  August 2015   . Shingles (herpes zoster) polyneuropathy 06/22/2017  . Sleep apnea 05/24/2007   Qualifier: Diagnosis of  By: [name]    . SMOKER 04/18/2007   Qualifier: Diagnosis of  By: Ronnald Ramp MD, Lauren    . THYROMEGALY 08/17/2007   Qualifier: Diagnosis of  By: [name]      Past Surgical History:  Procedure Laterality Date  .  APPENDECTOMY  03/2017  . AUGMENTATION MAMMAPLASTY    . bladder tuck surgery    . COLONOSCOPY  03/04/2016   Colonic polyps status post polypectomy. Small internal hemrrhoids. Monir degree of rectal prolapse  . COLONOSCOPY  06/19/2019  . CORONARY ANGIOPLASTY WITH STENT PLACEMENT  06/30/2017  . CORONARY STENT INTERVENTION N/A 06/30/2017   Procedure: CORONARY STENT INTERVENTION;  Surgeon: Burnell Blanks, MD;  Location: [facility];  Service: Cardiovascular;  Laterality: N/A;  . DILATION AND CURETTAGE OF UTERUS    . ELBOW FRACTURE SURGERY Left   . ESOPHAGOGASTRODUODENOSCOPY     Dr Dennison Nancy  . FRACTURE SURGERY    . INCONTINENCE SURGERY  2004  . laparoscopy and mesenteric and omental biopsies  03/16/2014   Dr Pauletta Browns  . LEFT HEART CATH AND CORONARY ANGIOGRAPHY N/A 06/30/2017   Procedure: LEFT HEART CATH AND CORONARY ANGIOGRAPHY;  Surgeon: Burnell Blanks, MD;  Location: [facility];  Service: Cardiovascular;  Laterality: N/A;  . MOHS SURGERY    . PLACEMENT OF BREAST IMPLANTS  1998  . TUBAL LIGATION  1988  . UPPER GASTROINTESTINAL ENDOSCOPY  2021   also had one previously  . VAGINAL HYSTERECTOMY  2004    Family History  Problem Relation Age of Onset  . Breast cancer Mother   . Anemia Mother   . Hypertension Mother   . Hyperlipidemia Mother   . Prostate cancer Father   . Heart disease Father   . Hypertension Father   . Skin cancer Father   . Hyperlipidemia Father   . Heart attack Father   . Heart disease Brother   . Hypertension Brother   . Prostate cancer Brother   . Hyperlipidemia Brother   . Heart attack Brother   . Heart disease Sister   . Hypertension Sister   . Hyperlipidemia Sister   . Heart attack Sister   . Hypertension Maternal Grandmother   . Hyperlipidemia Maternal Grandmother   . Colon polyps Maternal Grandmother   . Hypertension Maternal Grandfather   . Hyperlipidemia Maternal Grandfather   . Stroke Maternal Grandfather   .  Heart attack Maternal Grandfather   . Heart disease Maternal Grandfather   . Hypertension Paternal Grandmother   . Hyperlipidemia Paternal Grandmother   . Stomach cancer Paternal Grandmother   . Hypertension Paternal Grandfather   . Hyperlipidemia Paternal Grandfather   . Heart attack Paternal Grandfather   . Heart disease Paternal Grandfather   . Colon cancer Neg Hx   . Esophageal cancer Neg Hx   . Rectal cancer Neg Hx     Social History  Tobacco Use  . Smoking status: Current Every Day Smoker    Packs/day: 0.50    Years: 41.00    Pack years: 20.50    Types: Cigarettes  . Smokeless tobacco: Never Used  . Tobacco comment: using vape. Does 5 cigs a day   Vaping Use  . Vaping Use: Former  . Substances: Nicotine  Substance Use Topics  . Alcohol use: Not Currently  . Drug use: Never    Current Outpatient Medications  Medication Sig Dispense Refill  . albuterol (PROVENTIL HFA;VENTOLIN HFA) 108 (90 BASE) MCG/ACT inhaler Inhale two puffs every 4-6 hours only as needed for shortness of breath or wheezing. 1 Inhaler 1  . albuterol (PROVENTIL) (2.5 MG/3ML) 0.083% nebulizer solution Take 3 mLs (2.5 mg total) by nebulization every 6 (six) hours as needed for wheezing. 75 mL 12  . ALPRAZolam (XANAX) 0.5 MG tablet Take 0.5 mg by mouth 4 (four) times daily as needed.    . AMBULATORY NON FORMULARY MEDICATION Cpap titration and mask fitting Dx sleep apnea 1 each 0  . aspirin EC 81 MG tablet Take 81 mg by mouth daily.    Marland Kitchen atorvastatin (LIPITOR) 40 MG tablet Take 1 tablet (40 mg total) by mouth daily at 6 PM. 90 tablet 0  . buPROPion (WELLBUTRIN SR) 150 MG 12 hr tablet Take 150 mg by mouth daily.  0  . Carboxymethylcellul-Glycerin (REFRESH OPTIVE OP) Place 2 drops into both eyes as needed.    . clopidogrel (PLAVIX) 75 MG tablet Take 75 mg tablet once a day 90 tablet 3  . ezetimibe (ZETIA) 10 MG tablet Take 1 tablet (10 mg total) by mouth daily. 90 tablet 3  . LINZESS 72 MCG capsule Take  72 mcg by mouth daily as needed.    . nitroGLYCERIN (NITROSTAT) 0.4 MG SL tablet Place 1 tablet (0.4 mg total) under the tongue every 5 (five) minutes as needed. (Patient not taking: Reported on 08/07/2020) 25 tablet 2  . pantoprazole (PROTONIX) 40 MG tablet Take 1 tablet (40 mg total) by mouth daily. 30 tablet 7  . predniSONE (DELTASONE) 5 MG tablet Take 5 mg by mouth daily.  45 tablet 2  . SPIRIVA RESPIMAT 2.5 MCG/ACT AERS Take 1 puff by mouth daily.    . valACYclovir (VALTREX) 1000 MG tablet Take 1,000 mg by mouth as needed.    . zolpidem (AMBIEN) 10 MG tablet Take 10 mg by mouth at bedtime as needed.     Current Facility-Administered Medications  Medication Dose Route Frequency Provider Last Rate Last Admin  . 0.9 %  sodium chloride infusion  500 mL Intravenous Once Gatha Mayer, MD        Allergies  Allergen Reactions  . Naproxen Anaphylaxis and Other (See Comments)    Throat closes can't breathe    Review of Systems:  neg     Physical Exam:    Not examined since was a televisit.  I connected with  [name] on 08/11/20 by a video enabled telemedicine application and verified that I am speaking with the correct person using two identifiers.   I discussed the limitations of evaluation and management by telemedicine. The patient expressed understanding and agreed to proceed.  Time spent: 15 min  Patient at home Physician at physicians office Telephone visit Unable to connect through video   Carmell Austria, MD 08/07/2020, 11:17 AM  Cc: Cher Nakai, MD

## 2020-09-19 DIAGNOSIS — Z87442 Personal history of urinary calculi: Secondary | ICD-10-CM | POA: Insufficient documentation

## 2020-10-01 NOTE — Progress Notes (Signed)
Cardiology Office Note:    Date:  10/02/2020   ID:  [name], DOB March 02, 1966, MRN [id number]  PCP:  Cher Nakai, MD  Cardiologist:  Shirlee More, MD    Referring MD: Cher Nakai, MD    ASSESSMENT:    1. Coronary artery disease involving native coronary artery of native heart with angina pectoris ([place])   2. Hypertension, benign   3. Pure hypercholesterolemia    PLAN:    In order of problems listed above:  She has stable CAD more than 2 years remote from PCI and stent her planned surgical procedure is low risk I suspect she will have a regional anesthesia and she can withdraw dual antiplatelet therapy or clopidogrel she will discuss with her surgeon and resume 48 hours after and continue medical treatment including her combined statin and Zetia. Stable presently is not on antihypertensive agents Ideal lipids high risk with CAD PCI and stenting continue combined statin Zetia therapy   Next appointment: 6 months   Medication Adjustments/Labs and Tests Ordered: Current medicines are reviewed at length with the patient today.  Concerns regarding medicines are outlined above.  Orders Placed This Encounter  Procedures   EKG 12-Lead    No orders of the defined types were placed in this encounter.   Chief Complaint  Patient presents with   Follow-up   Coronary Artery Disease     History of Present Illness:    ANGELETTE GANUS is a 55 y.o. female with a hx of CAD with PCI and stent proximal LAD 10/30/2017 non-Hodgkin's lymphoma hypertension dyslipidemia and COPD last seen 02/16/2020.  Her Myoview test performed 10/11/2019 showed ejection fraction 66% normal left ventricular function and a small mild defect in anteroseptal segment.  I personally reviewed the images and she had a small fixed defect consistent with attenuation.  Compliance with diet, lifestyle and medications: Yes  Left heart cath 06/30/2017: Conclusion Mid RCA lesion is 60% stenosed. Prox Cx lesion is  20% stenosed. Prox LAD-1 lesion is 80% stenosed. Prox LAD-2 lesion is 50% stenosed. A drug-eluting stent was successfully placed using a STENT RESOLUTE ONYX 2.5X18. Post intervention, there is a 0% residual stenosis. Post intervention, there is a 0% residual stenosis. The left ventricular systolic function is normal. LV end diastolic pressure is normal. The left ventricular ejection fraction is 55-65% by visual estimate. There is no mitral valve regurgitation. 1. Severe stenosis proximal LAD. 2. Successful PTCA/DES x 1 proximal LAD 3. Mild non-obstructive disease in the proximal Circumflex 4. Moderate stenosis in the small caliber, mid RCA 5. 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 For CAD is stable she is more than 2 years remote from PCI she has preserved exercise tolerance and she can withdraw her dual antiplatelet therapy 5 days prior to surgery and resume 48 hours afterwards. I asked her to discuss with the surgeon if she also needs to stop aspirin or only clopidogrel. Recent labs were optimal cholesterol 119 LDL 45 triglycerides 108 HDL 54 random glucose 79 creatinine 1.04 and she tolerates her dual antiplatelet therapy without GI side effects and combined statin and Zetia without muscle pain or weakness. She has had no angina shortness of breath edema palpitation or syncope. She is concerned about progression of her lymphoma with enlargement of liver and spleen. Past Medical History:  Diagnosis Date   Adult BMI <19 kg/sq m 06/22/2017   ALLERGIC RHINITIS  11/05/2009   Qualifier: Diagnosis of  By: [name]     Anxiety 06/22/2017   Arteritis ([place]) 06/22/2017   Arteritis ([place])    Chronic bronchitis ([place])    Chronic idiopathic constipation 09/05/2012   Chronic nausea 06/22/2017   CHRONIC OBSTRUCTIVE PULMONARY DISEASE, ACUTE EXACERBATION 10/17/2009   Qualifier: Diagnosis of   By: [name]     COMMON MIGRAINE 08/17/2008   Qualifier: Diagnosis of  By: Valetta Close DO, Karen     COPD 05/24/2007   Qualifier: Diagnosis of  By: Valetta Close DO, Karen     COPD GOLD II 05/24/2007   Refuses any inhalers with black box warning of sudden cardiac death - spirometry Jul 21, 2013 FEV1  2.07 ( 74%) ratio 69 so GOLD II barely but still smoking      Coronary artery disease    Cough 06/10/2013   Followed in Pulmonary clinic/ [facility]/ [place]     DEPRESSION 05/24/2007   Qualifier: Diagnosis of  By: [name]     Depression 05/24/2007   Dysuria 01/06/2010   Qualifier: Diagnosis of  By: [name]     FATIGUE 01/06/2010   Qualifier: Diagnosis of  By: Valetta Close DO, Karen     GERD without esophagitis 06/22/2017   Hemoptysis 06/10/2013   Followed in Pulmonary clinic/ [facility]/ [place]     History of hiatal hernia    History of kidney stones    Hyperlipidemia LDL goal < 160 22-Jul-2007   [place] Risk 3% Dec 2013     Hypertension, benign 04/18/2007   Impaired fasting glucose 09/17/2009   Qualifier: Diagnosis of  By: Birdie Riddle MD, Belenda Cruise     Migraine    "take RX qd; still have ~ 1/wk" (06/30/2017)   Non-Hodgkin lymphoma ([place]) dx'd 03/2014   OSA on CPAP    OSA on CPAP 02/26/2014   CPAP titration to 13 CWP, AHI 0 per hour. She wore an extra small ResMed AirFit F-10 fullface mask with heated humidifier    Other insomnia 06/22/2017   PANIC DISORDER 04/18/2007   Qualifier: Diagnosis of  By: Ronnald Ramp MD, Lauren     Polymyalgia rheumatica ([place]) 12/02/2012   Taper begun November 2014,  7.5 --> 5mg  August 2015    Shingles (herpes zoster) polyneuropathy 06/22/2017   Sleep apnea 05/24/2007   Qualifier: Diagnosis of  By: Richelle Ito 04/18/2007   Qualifier: Diagnosis of  By: Ronnald Ramp MD, Lauren     THYROMEGALY 08/17/2007   Qualifier: Diagnosis of  By: [name]      Past Surgical History:  Procedure Laterality Date   APPENDECTOMY  03/2017   AUGMENTATION MAMMAPLASTY      bladder tuck surgery     COLONOSCOPY  03/04/2016   Colonic polyps status post polypectomy. Small internal hemrrhoids. Monir degree of rectal prolapse   COLONOSCOPY  06/19/2019   CORONARY ANGIOPLASTY WITH STENT PLACEMENT  06/30/2017   CORONARY STENT INTERVENTION N/A 06/30/2017   Procedure: CORONARY STENT INTERVENTION;  Surgeon: Burnell Blanks, MD;  Location: [facility];  Service: Cardiovascular;  Laterality: N/A;   DILATION AND CURETTAGE OF UTERUS     ELBOW FRACTURE SURGERY Left    ESOPHAGOGASTRODUODENOSCOPY     Dr Dennison Nancy   FRACTURE SURGERY     INCONTINENCE SURGERY  2004   laparoscopy and mesenteric and omental biopsies  03/16/2014   Dr Pauletta Browns   LEFT HEART CATH AND CORONARY ANGIOGRAPHY N/A 06/30/2017   Procedure: LEFT  HEART CATH AND CORONARY ANGIOGRAPHY;  Surgeon: Burnell Blanks, MD;  Location: [facility];  Service: Cardiovascular;  Laterality: N/A;   MOHS SURGERY     PLACEMENT OF BREAST IMPLANTS  1998   TUBAL LIGATION  1988   UPPER GASTROINTESTINAL ENDOSCOPY  2021   also had one previously   VAGINAL HYSTERECTOMY  2004    Current Medications: Current Meds  Medication Sig   albuterol (PROVENTIL HFA;VENTOLIN HFA) 108 (90 BASE) MCG/ACT inhaler Inhale two puffs every 4-6 hours only as needed for shortness of breath or wheezing.   albuterol (PROVENTIL) (2.5 MG/3ML) 0.083% nebulizer solution Take 3 mLs (2.5 mg total) by nebulization every 6 (six) hours as needed for wheezing.   ALPRAZolam (XANAX) 0.5 MG tablet Take 0.5 mg by mouth 4 (four) times daily as needed.   AMBULATORY NON FORMULARY MEDICATION Cpap titration and mask fitting Dx sleep apnea   aspirin EC 81 MG tablet Take 81 mg by mouth daily.   atorvastatin (LIPITOR) 40 MG tablet Take 1 tablet (40 mg total) by mouth daily at 6 PM.   buPROPion (WELLBUTRIN SR) 150 MG 12 hr tablet Take 150 mg by mouth daily.   Carboxymethylcellul-Glycerin (REFRESH OPTIVE OP) Place 2 drops into both eyes as needed.    clopidogrel (PLAVIX) 75 MG tablet Take 75 mg tablet once a day   ezetimibe (ZETIA) 10 MG tablet Take 1 tablet (10 mg total) by mouth daily.   HYDROcodone-acetaminophen (NORCO/VICODIN) 5-325 MG tablet Take 1 tablet by mouth every 6 (six) hours as needed for migraine.   LINZESS 72 MCG capsule Take 72 mcg by mouth daily as needed.   nitroGLYCERIN (NITROSTAT) 0.4 MG SL tablet Place 1 tablet (0.4 mg total) under the tongue every 5 (five) minutes as needed.   pantoprazole (PROTONIX) 40 MG tablet Take 1 tablet (40 mg total) by mouth daily.   predniSONE (DELTASONE) 5 MG tablet Take 5 mg by mouth daily.    SPIRIVA RESPIMAT 2.5 MCG/ACT AERS Take 1 puff by mouth daily.   valACYclovir (VALTREX) 1000 MG tablet Take 1,000 mg by mouth as needed.   zolpidem (AMBIEN) 10 MG tablet Take 10 mg by mouth at bedtime as needed.   Current Facility-Administered Medications for the 10/02/20 encounter (Office Visit) with Richardo Priest, MD  Medication   0.9 %  sodium chloride infusion     Allergies:   Naproxen   Social History   Socioeconomic History   Marital status: Married    Spouse name: Not on file   Number of children: Not on file   Years of education: Not on file   Highest education level: Not on file  Occupational History   Occupation: Agricultural consultant: [employer]  Tobacco Use   Smoking status: Every Day    Packs/day: 0.50    Years: 41.00    Pack years: 20.50    Types: Cigarettes   Smokeless tobacco: Never   Tobacco comments:    using vape. Does 5 cigs a day   Vaping Use   Vaping Use: Former   Substances: Nicotine  Substance and Sexual Activity   Alcohol use: Not Currently   Drug use: Never   Sexual activity: Not on file  Other Topics Concern   Not on file  Social History Narrative   Not on file   Social Determinants of Health   Financial Resource Strain: Not on file  Food Insecurity: Not on file  Transportation Needs: Not on file  Physical Activity: Not on file   Stress: Not on file  Social Connections: Not on file     Family History: The patient's family history includes Anemia in her mother; Breast cancer in her mother; Colon polyps in her maternal grandmother; Heart attack in her brother, father, maternal grandfather, paternal grandfather, and sister; Heart disease in her brother, father, maternal grandfather, paternal grandfather, and sister; Hyperlipidemia in her brother, father, maternal grandfather, maternal grandmother, mother, paternal grandfather, paternal grandmother, and sister; Hypertension in her brother, father, maternal grandfather, maternal grandmother, mother, paternal grandfather, paternal grandmother, and sister; Prostate cancer in her brother and father; Skin cancer in her father; Stomach cancer in her paternal grandmother; Stroke in her maternal grandfather. There is no history of Colon cancer, Esophageal cancer, or Rectal cancer. ROS:   Please see the history of present illness.    All other systems reviewed and are negative.  EKGs/Labs/Other Studies Reviewed:    The following studies were reviewed today:  EKG:  EKG ordered today and personally reviewed.  The ekg ordered today demonstrates sinus rhythm normal EKG    Physical Exam:    VS:  BP 126/76 (BP Location: Right Arm, Patient Position: Sitting, Cuff Size: Normal)   Pulse 70   Ht 5\' 5"  (1.651 m)   Wt 152 lb 3.2 oz (69 kg)   SpO2 98%   BMI 25.33 kg/m     Wt Readings from Last 3 Encounters:  10/02/20 152 lb 3.2 oz (69 kg)  08/07/20 145 lb (65.8 kg)  02/16/20 149 lb 12.8 oz (67.9 kg)     GEN:  Well nourished, well developed in no acute distress HEENT: Normal NECK: No JVD; No carotid bruits LYMPHATICS: No lymphadenopathy CARDIAC: RRR, no murmurs, rubs, gallops RESPIRATORY:  Clear to auscultation without rales, wheezing or rhonchi  ABDOMEN: Soft, non-tender, non-distended MUSCULOSKELETAL:  No edema; No deformity  SKIN: Warm and dry NEUROLOGIC:  Alert and  oriented x 3 PSYCHIATRIC:  Normal affect    Signed, Shirlee More, MD  10/02/2020 8:24 AM    [place]

## 2020-10-02 ENCOUNTER — Other Ambulatory Visit: Payer: Self-pay

## 2020-10-02 ENCOUNTER — Ambulatory Visit: Payer: BC Managed Care – PPO | Admitting: Cardiology

## 2020-10-02 ENCOUNTER — Encounter: Payer: Self-pay | Admitting: Cardiology

## 2020-10-02 VITALS — BP 126/76 | HR 70 | Ht 65.0 in | Wt 152.2 lb

## 2020-10-02 DIAGNOSIS — I25119 Atherosclerotic heart disease of native coronary artery with unspecified angina pectoris: Secondary | ICD-10-CM | POA: Diagnosis not present

## 2020-10-02 DIAGNOSIS — I10 Essential (primary) hypertension: Secondary | ICD-10-CM | POA: Diagnosis not present

## 2020-10-02 DIAGNOSIS — E7800 Pure hypercholesterolemia, unspecified: Secondary | ICD-10-CM

## 2020-10-02 NOTE — Patient Instructions (Signed)
Medication Instructions:  Your physician recommends that you continue on your current medications as directed. Please refer to the Current Medication list given to you today.  Please stop your Aspirin and Plavix 5 days prior to your surgery.  *If you need a refill on your cardiac medications before your next appointment, please call your pharmacy*   Lab Work: None If you have labs (blood work) drawn today and your tests are completely normal, you will receive your results only by: [facility] (if you have MyChart) OR A paper copy in the mail If you have any lab test that is abnormal or we need to change your treatment, we will call you to review the results.   Testing/Procedures: None   Follow-Up: At [facility], you and your health needs are our priority.  As part of our continuing mission to provide you with exceptional heart care, we have created designated Provider Care Teams.  These Care Teams include your primary Cardiologist (physician) and Advanced Practice Providers (APPs -  Physician Assistants and Nurse Practitioners) who all work together to provide you with the care you need, when you need it.  We recommend signing up for the patient portal called "MyChart".  Sign up information is provided on this After Visit Summary.  MyChart is used to connect with patients for Virtual Visits (Telemedicine).  Patients are able to view lab/test results, encounter notes, upcoming appointments, etc.  Non-urgent messages can be sent to your provider as well.   To learn more about what you can do with MyChart, go to [email or web address].    Your next appointment:   6 month(s)  The format for your next appointment:   In Person  Provider:   Shirlee More, MD   Other Instructions

## 2020-10-16 ENCOUNTER — Other Ambulatory Visit: Payer: Self-pay | Admitting: Cardiology

## 2021-01-09 ENCOUNTER — Other Ambulatory Visit: Payer: Self-pay | Admitting: Cardiology

## 2021-01-09 NOTE — Telephone Encounter (Signed)
Clopidogrel 75 mg tablets # 90 x 1 refill sent to  [facility], [place]

## 2021-04-25 ENCOUNTER — Ambulatory Visit: Payer: BC Managed Care – PPO | Admitting: Cardiology

## 2021-04-25 ENCOUNTER — Encounter: Payer: Self-pay | Admitting: Cardiology

## 2021-04-25 ENCOUNTER — Other Ambulatory Visit: Payer: Self-pay

## 2021-04-25 VITALS — Ht 65.0 in | Wt 161.0 lb

## 2021-04-25 DIAGNOSIS — I10 Essential (primary) hypertension: Secondary | ICD-10-CM | POA: Diagnosis not present

## 2021-04-25 DIAGNOSIS — E7800 Pure hypercholesterolemia, unspecified: Secondary | ICD-10-CM | POA: Diagnosis not present

## 2021-04-25 DIAGNOSIS — I25119 Atherosclerotic heart disease of native coronary artery with unspecified angina pectoris: Secondary | ICD-10-CM

## 2021-04-25 NOTE — Progress Notes (Signed)
Cardiology Office Note:    Date:  04/25/2021   ID:  [name], DOB March 14, 1966, MRN [id number]  PCP:  Cher Nakai, MD  Cardiologist:  Shirlee More, MD    Referring MD: Cher Nakai, MD    ASSESSMENT:    1. Coronary artery disease involving native coronary artery of native heart with angina pectoris ([place])   2. Hypertension, benign   3. Pure hypercholesterolemia    PLAN:    In order of problems listed above:  Continues to do well having no angina New York Heart Association class I following PCI she is maintained on long-term dual antiplatelet therapy aspirin and clopidogrel could be withdrawn from her splenic lipids are ideal and when I reviewed her perfusion study she had done in July 2021 in my opinion she had no ischemia she had attenuation.  I will think she will need another ischemia evaluation Stable at target  Continue combined high intensity statin and Zetia LDL at target   Next appointment: 9 months   Medication Adjustments/Labs and Tests Ordered: Current medicines are reviewed at length with the patient today.  Concerns regarding medicines are outlined above.  No orders of the defined types were placed in this encounter.  No orders of the defined types were placed in this encounter.   Chief Complaint  Patient presents with   Follow-up   Coronary Artery Disease    History of Present Illness:    Rose [name] is a 56 y.o. female with a hx of CAD with PCI and stent proximal LAD in 10/30/2017 hypertension dyslipidemia COPD and non-Hodgkin's lymphoma.  A Myoview perfusion study 10/11/2019 showed EF 66% and a small defect in the anterior septal region consistent with attenuation.  Coronary angiography 06/30/2017 showed severe stenosis proximal LAD with successful PCI and drug-eluting stent.  She was last seen 10/02/2020. Compliance with diet, lifestyle and medications: Yes  Continues to do well no angina dyspnea palpitations syncope.  Recent labs with primary  care physician reassured 04/03/2021 cholesterol 163 LDL 78 HDL 60 triglycerides 148 A1c 6.1% hemoglobin 13.6 creatinine 1.2. She think she will be having a splenectomy done in March through her oncology group in [place] Past Medical History:  Diagnosis Date   Adult BMI <19 kg/sq m 06/22/2017   ALLERGIC RHINITIS 11/05/2009   Qualifier: Diagnosis of  By: [name]     Anxiety 06/22/2017   Arteritis ([place]) 06/22/2017   Arteritis ([place])    Chronic bronchitis ([place])    Chronic idiopathic constipation 09/05/2012   Chronic nausea 06/22/2017   CHRONIC OBSTRUCTIVE PULMONARY DISEASE, ACUTE EXACERBATION 10/17/2009   Qualifier: Diagnosis of  By: [name]     COMMON MIGRAINE 08/17/2008   Qualifier: Diagnosis of  By: Valetta Close DO, Karen     COPD 05/24/2007   Qualifier: Diagnosis of  By: Valetta Close DO, Karen     COPD GOLD II 05/24/2007   Refuses any inhalers with black box warning of sudden cardiac death - spirometry 07/16/2013 FEV1  2.07 ( 74%) ratio 69 so GOLD II barely but still smoking      Coronary artery disease    Cough 06/10/2013   Followed in Pulmonary clinic/ [facility]/ [place]     DEPRESSION 05/24/2007   Qualifier: Diagnosis of  By: [name]     Depression 05/24/2007   Dysuria 01/06/2010   Qualifier: Diagnosis of  By: [name]     FATIGUE 01/06/2010   Qualifier: Diagnosis of  By: Valetta Close DO,  Santiago Glad     GERD without esophagitis 06/22/2017   Hemoptysis 06/10/2013   Followed in Pulmonary clinic/ [facility]/ [place]     History of hiatal hernia    History of kidney stones    Hyperlipidemia LDL goal < 160 06/24/2007   [place] Risk 3% Dec 2013     Hypertension, benign 04/18/2007   Impaired fasting glucose 09/17/2009   Qualifier: Diagnosis of  By: Birdie Riddle MD, Belenda Cruise     Migraine    "take RX qd; still have ~ 1/wk" (06/30/2017)   Non-Hodgkin lymphoma ([place]) dx'd 03/2014   OSA on CPAP    OSA on CPAP 02/26/2014   CPAP titration to 13 CWP, AHI 0 per hour. She wore an extra small ResMed  AirFit F-10 fullface mask with heated humidifier    Other insomnia 06/22/2017   PANIC DISORDER 04/18/2007   Qualifier: Diagnosis of  By: Ronnald Ramp MD, Lauren     Polymyalgia rheumatica ([place]) 12/02/2012   Taper begun November 2014,  7.5 --> 5mg  August 2015    Shingles (herpes zoster) polyneuropathy 06/22/2017   Sleep apnea 05/24/2007   Qualifier: Diagnosis of  By: Richelle Ito 04/18/2007   Qualifier: Diagnosis of  By: Ronnald Ramp MD, Lauren     THYROMEGALY 08/17/2007   Qualifier: Diagnosis of  By: [name]      Past Surgical History:  Procedure Laterality Date   APPENDECTOMY  03/2017   AUGMENTATION MAMMAPLASTY     bladder tuck surgery     COLONOSCOPY  03/04/2016   Colonic polyps status post polypectomy. Small internal hemrrhoids. Monir degree of rectal prolapse   COLONOSCOPY  06/19/2019   CORONARY ANGIOPLASTY WITH STENT PLACEMENT  06/30/2017   CORONARY STENT INTERVENTION N/A 06/30/2017   Procedure: CORONARY STENT INTERVENTION;  Surgeon: Burnell Blanks, MD;  Location: [facility];  Service: Cardiovascular;  Laterality: N/A;   DILATION AND CURETTAGE OF UTERUS     ELBOW FRACTURE SURGERY Left    ESOPHAGOGASTRODUODENOSCOPY     Dr Misenhemier   FRACTURE SURGERY     INCONTINENCE SURGERY  2004   laparoscopy and mesenteric and omental biopsies  03/16/2014   Dr Pauletta Browns   LEFT HEART CATH AND CORONARY ANGIOGRAPHY N/A 06/30/2017   Procedure: LEFT HEART CATH AND CORONARY ANGIOGRAPHY;  Surgeon: Burnell Blanks, MD;  Location: [facility];  Service: Cardiovascular;  Laterality: N/A;   MOHS SURGERY     PLACEMENT OF BREAST IMPLANTS  1998   TUBAL LIGATION  1988   UPPER GASTROINTESTINAL ENDOSCOPY  2021   also had one previously   VAGINAL HYSTERECTOMY  2004    Current Medications: Current Meds  Medication Sig   albuterol (PROVENTIL HFA;VENTOLIN HFA) 108 (90 BASE) MCG/ACT inhaler Inhale two puffs every 4-6 hours only as needed for shortness of breath or  wheezing.   albuterol (PROVENTIL) (2.5 MG/3ML) 0.083% nebulizer solution Take 3 mLs (2.5 mg total) by nebulization every 6 (six) hours as needed for wheezing.   ALPRAZolam (XANAX) 0.5 MG tablet Take 0.5 mg by mouth 4 (four) times daily as needed for anxiety.   AMBULATORY NON FORMULARY MEDICATION Cpap titration and mask fitting Dx sleep apnea   aspirin EC 81 MG tablet Take 81 mg by mouth daily.   atorvastatin (LIPITOR) 40 MG tablet Take 1 tablet (40 mg total) by mouth daily at 6 PM.   buPROPion (WELLBUTRIN SR) 150 MG 12 hr tablet Take 150 mg by mouth daily.  Carboxymethylcellul-Glycerin (REFRESH OPTIVE OP) Place 2 drops into both eyes as needed (dry eyes).   clopidogrel (PLAVIX) 75 MG tablet TAKE 1 TABLET BY MOUTH EVERY DAY   ezetimibe (ZETIA) 10 MG tablet TAKE 1 TABLET(10 MG) BY MOUTH DAILY   HYDROcodone-acetaminophen (NORCO/VICODIN) 5-325 MG tablet Take 1 tablet by mouth every 6 (six) hours as needed for migraine.   LINZESS 72 MCG capsule Take 72 mcg by mouth daily as needed (constipation).   nitroGLYCERIN (NITROSTAT) 0.4 MG SL tablet Place 1 tablet (0.4 mg total) under the tongue every 5 (five) minutes as needed.   pantoprazole (PROTONIX) 40 MG tablet Take 1 tablet (40 mg total) by mouth daily.   predniSONE (DELTASONE) 5 MG tablet Take 5 mg by mouth daily.    SPIRIVA RESPIMAT 2.5 MCG/ACT AERS Take 1 puff by mouth daily.   valACYclovir (VALTREX) 1000 MG tablet Take 1,000 mg by mouth as needed (cold sores).   zolpidem (AMBIEN) 10 MG tablet Take 10 mg by mouth at bedtime as needed for sleep.   Current Facility-Administered Medications for the 04/25/21 encounter (Office Visit) with Richardo Priest, MD  Medication   0.9 %  sodium chloride infusion     Allergies:   Naproxen   Social History   Socioeconomic History   Marital status: Married    Spouse name: Not on file   Number of children: Not on file   Years of education: Not on file   Highest education level: Not on file  Occupational  History   Occupation: Agricultural consultant: [employer]  Tobacco Use   Smoking status: Every Day    Packs/day: 0.50    Years: 41.00    Pack years: 20.50    Types: Cigarettes   Smokeless tobacco: Never   Tobacco comments:    using vape. Does 5 cigs a day   Vaping Use   Vaping Use: Former   Substances: Nicotine  Substance and Sexual Activity   Alcohol use: Not Currently   Drug use: Never   Sexual activity: Not on file  Other Topics Concern   Not on file  Social History Narrative   Not on file   Social Determinants of Health   Financial Resource Strain: Not on file  Food Insecurity: Not on file  Transportation Needs: Not on file  Physical Activity: Not on file  Stress: Not on file  Social Connections: Not on file     Family History: The patient's family history includes Anemia in her mother; Breast cancer in her mother; Colon polyps in her maternal grandmother; Heart attack in her brother, father, maternal grandfather, paternal grandfather, and sister; Heart disease in her brother, father, maternal grandfather, paternal grandfather, and sister; Hyperlipidemia in her brother, father, maternal grandfather, maternal grandmother, mother, paternal grandfather, paternal grandmother, and sister; Hypertension in her brother, father, maternal grandfather, maternal grandmother, mother, paternal grandfather, paternal grandmother, and sister; Prostate cancer in her brother and father; Skin cancer in her father; Stomach cancer in her paternal grandmother; Stroke in her maternal grandfather. There is no history of Colon cancer, Esophageal cancer, or Rectal cancer. ROS:   Please see the history of present illness.    All other systems reviewed and are negative.  EKGs/Labs/Other Studies Reviewed:    The following studies were reviewed today: Left heart cath 06/30/2017: Conclusion Mid RCA lesion is 60% stenosed. Prox Cx lesion is 20% stenosed. Prox LAD-1 lesion is 80% stenosed. Prox  LAD-2 lesion is 50% stenosed. A drug-eluting stent was  successfully placed using a STENT RESOLUTE ONYX 2.5X18. Post intervention, there is a 0% residual stenosis. Post intervention, there is a 0% residual stenosis. The left ventricular systolic function is normal. LV end diastolic pressure is normal. The left ventricular ejection fraction is 55-65% by visual estimate. There is no mitral valve regurgitation. 1. Severe stenosis proximal LAD. 2. Successful PTCA/DES x 1 proximal LAD 3. Mild non-obstructive disease in the proximal Circumflex 4. Moderate stenosis in the small caliber, mid RCA 5. Normal LV function EKG:  EKG ordered today and personally reviewed.  The ekg ordered today demonstrates sinus rhythm and remains normal      Physical Exam:    VS:  Ht 5\' 5"  (1.651 m)    Wt 161 lb (73 kg)    BMI 26.79 kg/m     Wt Readings from Last 3 Encounters:  04/25/21 161 lb (73 kg)  10/02/20 152 lb 3.2 oz (69 kg)  08/07/20 145 lb (65.8 kg)     GEN: She appears healthy well nourished, well developed in no acute distress HEENT: Normal NECK: No JVD; No carotid bruits LYMPHATICS: No lymphadenopathy CARDIAC: RRR, no murmurs, rubs, gallops RESPIRATORY:  Clear to auscultation without rales, wheezing or rhonchi  ABDOMEN: Soft, non-tender, non-distended MUSCULOSKELETAL:  No edema; No deformity  SKIN: Warm and dry NEUROLOGIC:  Alert and oriented x 3 PSYCHIATRIC:  Normal affect    Signed, Shirlee More, MD  04/25/2021 2:20 PM    [place]

## 2021-04-25 NOTE — Patient Instructions (Signed)

## 2021-05-13 ENCOUNTER — Telehealth: Payer: Self-pay

## 2021-05-13 ENCOUNTER — Telehealth: Payer: Self-pay | Admitting: Cardiology

## 2021-05-13 NOTE — Telephone Encounter (Signed)
° °  Patient Name: Rose [name]  DOB: [birth year] MRN: [id number]  Primary Cardiologist: Shirlee More, MD  Chart reviewed as part of pre-operative protocol coverage. Patient recently saw Dr. Bettina Gavia 04/25/21. Per his note, " Continues to do well having no angina New York Heart Association class I following PCI she is maintained on long-term dual antiplatelet therapy aspirin and clopidogrel could be withdrawn from her splenic lipids are ideal and when I reviewed her perfusion study she had done in July 2021 in my opinion she had no ischemia she had attenuation.  I will think she will need another ischemia evaluation." I do not see another ischemic evaluation ordered (query dictation error). I will route to Dr. Bettina Gavia to a) clarify whether he recommends additional ischemic testing prior to full mouth dental extraction and zygomatic implant under general anesthesia, as well as b) input whether patient may hold ASA + Plavix for this procedure if needed. Dr. Bettina Gavia - Please route response to [facility] PREOP (the pre-op pool). Thank you.   Charlie Pitter, PA-C 05/13/2021, 2:01 PM

## 2021-05-13 NOTE — Telephone Encounter (Signed)
° °  Patient Name: Rose [name]  DOB: 12/16/1965 MRN: [id number]  Primary Cardiologist: Shirlee More, MD  Chart reviewed as part of pre-operative protocol coverage.   Per Dr. Bettina Gavia, she may proceed with procedure without additional cardiac testing at this time. Per clarification regarding blood thinners, he feels she may hold ASA + Plavix for 5 days, restart aspirin the evening of surgery, and usually restart clopidogrel at 48 hours. Based on cardiac history outlined, no indication for SBE prophylaxis from cardiac standpoint at this time.  I reached out to patient for update on how she is doing. The patient affirms she has been doing well without any new cardiac symptoms. The patient was advised that if she develops new symptoms prior to surgery to contact our office to arrange for a follow-up visit, and she verbalized understanding. She clarifies she is not having extraction, but the zygomatic implant since she already has dentures.  Will route this bundled recommendation to requesting provider via Epic fax function. Please call with questions.  Charlie Pitter, PA-C 05/13/2021, 5:35 PM

## 2021-05-13 NOTE — Telephone Encounter (Signed)
° °  Pre-operative Risk Assessment    Patient Name: Rose [name]  DOB: 01-12-1966 MRN: [id number]     Request for Surgical Clearance    Procedure:   full mouth extraction, zygomatic implant   Date of Surgery:  Clearance 07/01/21                                 Surgeon:  Dr. Demetrius Revel Group or Practice Name:  [facility] Phone number:  [phone] Fax number:  [phone]   Type of Clearance Requested:   - Medical    Type of Anesthesia:  General    Additional requests/questions:      SignedMilbert Coulter  05/13/2021, 8:55 AM

## 2021-05-13 NOTE — Telephone Encounter (Signed)
Spoke to the patient just now and let her know in order to get this clearance put in we need to know the exact procedure that is being done and the physician that is doing the procedure. She is going to reach out to the dental office to have them send [place] a form with this information.    Encouraged patient to call back with any questions or concerns.

## 2021-06-04 NOTE — Telephone Encounter (Signed)
Patient was calling back to say the densist office didn't received an information from our office. Please refax info over. ?

## 2021-06-04 NOTE — Telephone Encounter (Signed)
Clearance notes have been re-faxed today to fax # [phone] ?

## 2021-06-04 NOTE — Telephone Encounter (Signed)
I left a message for the requesting office that we have not been able to get the fax to go through. Please call our office with an alternate fax #  ?

## 2021-06-05 NOTE — Telephone Encounter (Signed)
? ?  [facility] calling back, she said, they received clearance and also need pt's most recent EKG as well. She gave fax# [phone] ?

## 2021-06-05 NOTE — Telephone Encounter (Signed)
EKG faxed over to [facility] office  ?

## 2021-07-22 ENCOUNTER — Other Ambulatory Visit: Payer: Self-pay | Admitting: Cardiology

## 2021-08-30 ENCOUNTER — Other Ambulatory Visit: Payer: Self-pay | Admitting: Cardiology

## 2021-09-03 ENCOUNTER — Other Ambulatory Visit: Payer: Self-pay

## 2021-09-03 MED ORDER — PANTOPRAZOLE SODIUM 40 MG PO TBEC: 40.0000 mg | DELAYED_RELEASE_TABLET | Freq: Every day | ORAL | 0 refills | Status: DC

## 2022-01-06 ENCOUNTER — Other Ambulatory Visit: Payer: Self-pay | Admitting: Gastroenterology

## 2022-03-19 ENCOUNTER — Other Ambulatory Visit: Payer: Self-pay | Admitting: Cardiology

## 2022-03-19 NOTE — Telephone Encounter (Signed)
Refill to pharmacy 

## 2022-04-14 ENCOUNTER — Other Ambulatory Visit: Payer: Self-pay | Admitting: Gastroenterology

## 2022-04-15 ENCOUNTER — Ambulatory Visit: Payer: BC Managed Care – PPO | Attending: Cardiology | Admitting: Cardiology

## 2022-04-15 ENCOUNTER — Encounter: Payer: Self-pay | Admitting: Cardiology

## 2022-04-15 VITALS — BP 138/76 | HR 77 | Ht 65.0 in | Wt 167.0 lb

## 2022-04-15 DIAGNOSIS — E7800 Pure hypercholesterolemia, unspecified: Secondary | ICD-10-CM

## 2022-04-15 DIAGNOSIS — J449 Chronic obstructive pulmonary disease, unspecified: Secondary | ICD-10-CM | POA: Diagnosis not present

## 2022-04-15 DIAGNOSIS — I10 Essential (primary) hypertension: Secondary | ICD-10-CM

## 2022-04-15 DIAGNOSIS — I25119 Atherosclerotic heart disease of native coronary artery with unspecified angina pectoris: Secondary | ICD-10-CM

## 2022-04-15 MED ORDER — NITROGLYCERIN 0.4 MG SL SUBL: 0.4000 mg | SUBLINGUAL_TABLET | SUBLINGUAL | 1 refills | Status: AC | PRN

## 2022-04-15 MED ORDER — ISOSORBIDE MONONITRATE ER 30 MG PO TB24: 30.0000 mg | ORAL_TABLET | Freq: Every day | ORAL | 3 refills | Status: AC

## 2022-04-15 MED ORDER — CLOPIDOGREL BISULFATE 75 MG PO TABS: 75.0000 mg | ORAL_TABLET | Freq: Every day | ORAL | 3 refills | Status: DC

## 2022-04-15 NOTE — Progress Notes (Signed)
Cardiology Office Note:    Date:  04/15/2022   ID:  [name], DOB 08/09/1965, MRN [id number]  PCP:  Cher Nakai, MD  Cardiologist:  Shirlee More, MD    Referring MD: Cher Nakai, MD    ASSESSMENT:    1. Coronary artery disease involving native coronary artery of native heart with angina pectoris ([place])   2. Hypertension, benign   3. Pure hypercholesterolemia   4. Chronic obstructive pulmonary disease, unspecified COPD type ([place])    PLAN:    In order of problems listed above:  Overall is done well she has a stable pattern of angina with and without activity.  I suspect an element of coronary microvascular disease. Abnormal nitrate which I think would be beneficial continue aspirin nitroglycerin as needed.  Combined lipid-lowering therapy with Zetia and high intensity statin and most recent labs requested from her PCP Continue her current treatment for hypertension Stable COPD she is in good activity program I challenged her to do 6500 steps a day   Next appointment: 1 year   Medication Adjustments/Labs and Tests Ordered: Current medicines are reviewed at length with the patient today.  Concerns regarding medicines are outlined above.  No orders of the defined types were placed in this encounter.  No orders of the defined types were placed in this encounter.   Chief Complaint  Patient presents with   Follow-up   Coronary Artery Disease    History of Present Illness:    Rose [name] is a 57 y.o. female with a hx of  CAD with PCI and stent proximal LAD in 10/30/2017 hypertension dyslipidemia COPD and non-Hodgkin's lymphoma last seen 04/25/2021. Compliance with diet, lifestyle and medications: Yes  She has a pattern of angina with modified activities perhaps once a month and is quickly relieved with nitroglycerin Does awaken her at night no shortness of breath edema orthopnea or syncope She walks 45 minutes a day and does not occur during that activity We  discussed optimizing medical therapy and will place her on a long-acting oral nitrate that I think is improved percent at this time and open she requires a repeat ischemia eval ration Most recent laboratory studies are not available for me to review her lipid profile 0 16 2023 was good with cholesterol 160 LDL of 83 hemoglobin A1c 6.0 Past Medical History:  Diagnosis Date   Adult BMI <19 kg/sq m 06/22/2017   ALLERGIC RHINITIS 11/05/2009   Qualifier: Diagnosis of  By: [name]     Anxiety 06/22/2017   Arteritis ([place]) 06/22/2017   Arteritis ([place])    Chronic bronchitis ()    Chronic idiopathic constipation 09/05/2012   Chronic nausea 06/22/2017   CHRONIC OBSTRUCTIVE PULMONARY DISEASE, ACUTE EXACERBATION 10/17/2009   Qualifier: Diagnosis of  By: [name]     COMMON MIGRAINE 08/17/2008   Qualifier: Diagnosis of  By: [name]     COPD 05/24/2007   Qualifier: Diagnosis of  By: Valetta Close DO, Karen     COPD GOLD II 05/24/2007   Refuses any inhalers with black box warning of sudden cardiac death - spirometry Jul 11, 2013 FEV1  2.07 ( 74%) ratio 69 so GOLD II barely but still smoking      Coronary artery disease    Cough 06/10/2013   Followed in Pulmonary clinic/ [facility]/ [place]     DEPRESSION 05/24/2007   Qualifier: Diagnosis of  By: [name]     Depression 05/24/2007   Dysuria 01/06/2010  Qualifier: Diagnosis of  By: [name]     FATIGUE 01/06/2010   Qualifier: Diagnosis of  By: Valetta Close DO, Karen     GERD without esophagitis 06/22/2017   Hemoptysis 06/10/2013   Followed in Pulmonary clinic/ [facility]/ [place]     History of hiatal hernia    History of kidney stones    Hyperlipidemia LDL goal < 160 06/24/2007   [place] Risk 3% Dec 2013     Hypertension, benign 04/18/2007   Impaired fasting glucose 09/17/2009   Qualifier: Diagnosis of  By: Birdie Riddle MD, Belenda Cruise     Migraine    "take RX qd; still have ~ 1/wk" (06/30/2017)   Non-Hodgkin lymphoma ([place]) dx'd 03/2014    OSA on CPAP    OSA on CPAP 02/26/2014   CPAP titration to 13 CWP, AHI 0 per hour. She wore an extra small ResMed AirFit F-10 fullface mask with heated humidifier    Other insomnia 06/22/2017   PANIC DISORDER 04/18/2007   Qualifier: Diagnosis of  By: Ronnald Ramp MD, Lauren     Polymyalgia rheumatica ([place]) 12/02/2012   Taper begun November 2014,  7.5 --> '5mg'$  August 2015    Shingles (herpes zoster) polyneuropathy 06/22/2017   Sleep apnea 05/24/2007   Qualifier: Diagnosis of  By: Richelle Ito 04/18/2007   Qualifier: Diagnosis of  By: Ronnald Ramp MD, Lauren     THYROMEGALY 08/17/2007   Qualifier: Diagnosis of  By: [name]      Past Surgical History:  Procedure Laterality Date   APPENDECTOMY  03/2017   AUGMENTATION MAMMAPLASTY     bladder tuck surgery     COLONOSCOPY  03/04/2016   Colonic polyps status post polypectomy. Small internal hemrrhoids. Monir degree of rectal prolapse   COLONOSCOPY  06/19/2019   CORONARY ANGIOPLASTY WITH STENT PLACEMENT  06/30/2017   CORONARY STENT INTERVENTION N/A 06/30/2017   Procedure: CORONARY STENT INTERVENTION;  Surgeon: Burnell Blanks, MD;  Location: [place] [facility];  Service: Cardiovascular;  Laterality: N/A;   DILATION AND CURETTAGE OF UTERUS     ELBOW FRACTURE SURGERY Left    ESOPHAGOGASTRODUODENOSCOPY     Dr Misenhemier   FRACTURE SURGERY     INCONTINENCE SURGERY  2004   laparoscopy and mesenteric and omental biopsies  03/16/2014   Dr Pauletta Browns   LEFT HEART CATH AND CORONARY ANGIOGRAPHY N/A 06/30/2017   Procedure: LEFT HEART CATH AND CORONARY ANGIOGRAPHY;  Surgeon: Burnell Blanks, MD;  Location: [place] [facility];  Service: Cardiovascular;  Laterality: N/A;   MOHS SURGERY     PLACEMENT OF BREAST IMPLANTS  1998   TUBAL LIGATION  1988   UPPER GASTROINTESTINAL ENDOSCOPY  2021   also had one previously   VAGINAL HYSTERECTOMY  2004    Current Medications: Current Meds  Medication Sig   albuterol (PROVENTIL  HFA;VENTOLIN HFA) 108 (90 BASE) MCG/ACT inhaler Inhale two puffs every 4-6 hours only as needed for shortness of breath or wheezing.   albuterol (PROVENTIL) (2.5 MG/3ML) 0.083% nebulizer solution Take 3 mLs (2.5 mg total) by nebulization every 6 (six) hours as needed for wheezing.   ALPRAZolam (XANAX) 0.5 MG tablet Take 0.5 mg by mouth 4 (four) times daily as needed for anxiety.   AMBULATORY NON FORMULARY MEDICATION Cpap titration and mask fitting Dx sleep apnea   aspirin EC 81 MG tablet Take 81 mg by mouth daily.   atorvastatin (LIPITOR) 40 MG tablet Take 1 tablet (40 mg total) by  mouth daily at 6 PM.   buPROPion (WELLBUTRIN SR) 150 MG 12 hr tablet Take 150 mg by mouth daily.   Carboxymethylcellul-Glycerin (REFRESH OPTIVE OP) Place 2 drops into both eyes as needed (dry eyes).   clopidogrel (PLAVIX) 75 MG tablet Take 1 tablet (75 mg total) by mouth daily.   ezetimibe (ZETIA) 10 MG tablet TAKE 1 TABLET(10 MG) BY MOUTH DAILY   HYDROcodone-acetaminophen (NORCO/VICODIN) 5-325 MG tablet Take 1 tablet by mouth every 6 (six) hours as needed for migraine.   LINZESS 72 MCG capsule Take 72 mcg by mouth daily as needed (constipation).   nitroGLYCERIN (NITROSTAT) 0.4 MG SL tablet Place 1 tablet (0.4 mg total) under the tongue every 5 (five) minutes as needed.   pantoprazole (PROTONIX) 40 MG tablet TAKE 1 TABLET(40 MG) BY MOUTH DAILY   predniSONE (DELTASONE) 5 MG tablet Take 5 mg by mouth daily.    SPIRIVA RESPIMAT 2.5 MCG/ACT AERS Take 1 puff by mouth daily.   valACYclovir (VALTREX) 1000 MG tablet Take 1,000 mg by mouth as needed (cold sores).   Current Facility-Administered Medications for the 04/15/22 encounter (Office Visit) with Richardo Priest, MD  Medication   0.9 %  sodium chloride infusion     Allergies:   Naproxen   Social History   Socioeconomic History   Marital status: Married    Spouse name: Not on file   Number of children: Not on file   Years of education: Not on file   Highest  education level: Not on file  Occupational History   Occupation: Agricultural consultant: [employer]  Tobacco Use   Smoking status: Every Day    Packs/day: 0.50    Years: 41.00    Total pack years: 20.50    Types: Cigarettes   Smokeless tobacco: Never   Tobacco comments:    using vape. Does 5 cigs a day   Vaping Use   Vaping Use: Former   Substances: Nicotine  Substance and Sexual Activity   Alcohol use: Not Currently   Drug use: Never   Sexual activity: Not on file  Other Topics Concern   Not on file  Social History Narrative   Not on file   Social Determinants of Health   Financial Resource Strain: Not on file  Food Insecurity: Not on file  Transportation Needs: Not on file  Physical Activity: Not on file  Stress: Not on file  Social Connections: Not on file     Family History: The patient's family history includes Anemia in her mother; Breast cancer in her mother; Colon polyps in her maternal grandmother; Heart attack in her brother, father, maternal grandfather, paternal grandfather, and sister; Heart disease in her brother, father, maternal grandfather, paternal grandfather, and sister; Hyperlipidemia in her brother, father, maternal grandfather, maternal grandmother, mother, paternal grandfather, paternal grandmother, and sister; Hypertension in her brother, father, maternal grandfather, maternal grandmother, mother, paternal grandfather, paternal grandmother, and sister; Prostate cancer in her brother and father; Skin cancer in her father; Stomach cancer in her paternal grandmother; Stroke in her maternal grandfather. There is no history of Colon cancer, Esophageal cancer, or Rectal cancer. ROS:   Please see the history of present illness.    All other systems reviewed and are negative.  EKGs/Labs/Other Studies Reviewed:    The following studies were reviewed today:  Left heart cath 06/30/2017: Conclusion Mid RCA lesion is 60% stenosed. Prox Cx lesion is 20%  stenosed. Prox LAD-1 lesion is 80% stenosed. Prox LAD-2 lesion  is 50% stenosed. A drug-eluting stent was successfully placed using a STENT RESOLUTE ONYX 2.5X18. Post intervention, there is a 0% residual stenosis. Post intervention, there is a 0% residual stenosis. The left ventricular systolic function is normal. LV end diastolic pressure is normal. The left ventricular ejection fraction is 55-65% by visual estimate. There is no mitral valve regurgitation. 1. Severe stenosis proximal LAD. 2. Successful PTCA/DES x 1 proximal LAD 3. Mild non-obstructive disease in the proximal Circumflex 4. Moderate stenosis in the small caliber, mid RCA 5. Normal LV function  A Myoview perfusion study 10/11/2019 showed EF 66% and a small defect in the anterior septal region consistent with attenuation.  EKG:  EKG ordered today and personally reviewed.  The ekg ordered today demonstrates sinus rhythm normal EKG  Recent Labs: No results found for requested labs within last 365 days.  Recent Lipid Panel    Component Value Date/Time   CHOL 141 11/15/2019 0851   TRIG 88 11/15/2019 0851   HDL 68 11/15/2019 0851   CHOLHDL 2.1 11/15/2019 0851   CHOLHDL 3.6 03/22/2012 1038   VLDL 32 03/22/2012 1038   LDLCALC 56 11/15/2019 0851    Physical Exam:    VS:  BP (!) 144/82 (BP Location: Right Arm, Patient Position: Sitting)   Pulse 77   Ht '5\' 5"'$  (1.651 m)   Wt 167 lb (75.8 kg)   SpO2 97%   BMI 27.79 kg/m     Wt Readings from Last 3 Encounters:  04/15/22 167 lb (75.8 kg)  04/25/21 161 lb (73 kg)  10/02/20 152 lb 3.2 oz (69 kg)     GEN:  Well nourished, well developed in no acute distress HEENT: Normal NECK: No JVD; No carotid bruits LYMPHATICS: No lymphadenopathy CARDIAC: RRR, no murmurs, rubs, gallops RESPIRATORY:  Clear to auscultation without rales, wheezing or rhonchi  ABDOMEN: Soft, non-tender, non-distended MUSCULOSKELETAL:  No edema; No deformity  SKIN: Warm and dry NEUROLOGIC:  Alert  and oriented x 3 PSYCHIATRIC:  Normal affect    Signed, Shirlee More, MD  04/15/2022 3:26 PM    [facility]

## 2022-04-15 NOTE — Patient Instructions (Signed)
Medication Instructions:  Your physician has recommended you make the following change in your medication:   START: Imdur 30 mg daily START: Nitroglycerin 0.4 mg under the tongue every five minutes as needed for chest pain  *If you need a refill on your cardiac medications before your next appointment, please call your pharmacy*   Lab Work: None If you have labs (blood work) drawn today and your tests are completely normal, you will receive your results only by: [place] (if you have MyChart) OR A paper copy in the mail If you have any lab test that is abnormal or we need to change your treatment, we will call you to review the results.   Testing/Procedures: None   Follow-Up: At [facility], you and your health needs are our priority.  As part of our continuing mission to provide you with exceptional heart care, we have created designated Provider Care Teams.  These Care Teams include your primary Cardiologist (physician) and Advanced Practice Providers (APPs -  Physician Assistants and Nurse Practitioners) who all work together to provide you with the care you need, when you need it.  We recommend signing up for the patient portal called "MyChart".  Sign up information is provided on this After Visit Summary.  MyChart is used to connect with patients for Virtual Visits (Telemedicine).  Patients are able to view lab/test results, encounter notes, upcoming appointments, etc.  Non-urgent messages can be sent to your provider as well.   To learn more about what you can do with MyChart, go to [email or web address].    Your next appointment:   1 year(s)  The format for your next appointment:   In Person  Provider:   Shirlee More, MD    Other Instructions Goal of 6500 steps or greater every day.  Important Information About Sugar

## 2022-07-10 ENCOUNTER — Telehealth: Payer: Self-pay | Admitting: Cardiology

## 2022-07-10 NOTE — ED Provider Notes (Signed)
 HPI Rose [name] is a 57 y.o. female who presents to the ED for chest pain.  Pressure sensation that started while washing her car today.  Resolved with nitro x 1.  She has a history of coronary disease status post stent 2 years ago in [place] .  She had a similar episode 2 weeks ago that required 3 nitros to resolve the pain.  She called her cardiologist in [place]  who advised her to come to the ER immediately.  She does report shortness of breath.  She is here because her husband is working in the area.  No abdominal pain or vomiting.   ROS As per the HPI, all other systems reviewed as negative.   PAST MEDICAL HISTORY No past medical history on file.  SURGICAL HISTORY No past surgical history on file.   SOCIAL HISTORY Positive for:  []   Alcohol  []  Tobacco  []   Illicit drugs,  FAMILY HISTORY No family history on file.   Medical, Surgical, Family and Social History were reviewed as documented above.   MEDICATIONS Home Medications 07/10/22 1724  Not on File   ALLERGIES  Allergies  Allergen Reactions  . Naproxen Anaphylactic shock-Allergy     Physical Exam Vitals:   07/10/22 2100  BP: (!) 140/80  Pulse: 62  Resp: 19  Temp:   SpO2: 97%   General: Vital Signs Reviewed. No acute Distress. Oriented to person, place, and time. Appears well-developed and well-nourished.  HEENT: Normocephalic, atraumatic. Pupils PERRL. Conjunctivae and EOM normal.  Neck: Neck supple. Full ROM. Resp/Chest: Lungs clear to auscultation bilaterally. Normal effort, No wheezes or rales.  Cardio: Heart tones normal. RRR Abdomen: Abdomen is soft, nontender, no rebound, no guarding. Neuro: Strength and sensation are symmetric and grossly normal. AAO to person, place, time.  Musculoskeletal: Normal joint range of motion. SKIN: Skin is warm, dry, normal color. No rash, not diaphoretic.   Results Labs: Recent Labs     Procedure Component Value Units Date/Time   Troponin I [[id number]]   (Normal) Collected: 07/10/22 2108   Specimen: Blood Updated: 07/10/22 2144    Troponin I (ng/mL) <0.01 ng/mL    D-dimer, Quantitative [[id number]]  (Normal) Collected: 07/10/22 1804   Specimen: Blood Updated: 07/10/22 1841    D-Dimer 0.27 ug/mL FEU    Troponin I [[id number]]  (Normal) Collected: 07/10/22 1804   Specimen: Blood Updated: 07/10/22 1840    Troponin I (ng/mL) <0.01 ng/mL    Comprehensive Metabolic Panel (CMP) [[id number]]  (Abnormal) Collected: 07/10/22 1804   Specimen: Blood Updated: 07/10/22 1835    Sodium 141 mmol/L     Potassium 3.5 mmol/L     Chloride 107 mmol/L     CO2 22 mmol/L     Anion Gap 12 mmol/L     BUN 9 mg/dL     Creatinine 9.13 mg/dL     Glucose 847* mg/dL     Calcium  9.5 mg/dL     AST 12 IU/L     ALT 25 IU/L     Alkaline Phosphatase 100 IU/L     Protein, Total 6.6 g/dL     Albumin 3.8 g/dL     Bilirubin, Total 0.4 mg/dL     eGFR 78 fO/fpw/8.26 m2    Narrative:     Fasting Glucose: Impaired Fasting 100 - 125 mg/dL Diagnostic of Diabetes Mellitus >/= 126 mg/dL  Random Glucose: Reference: 70 - 139 mg/dL Diagnostic of Diabetes Mellitus >/= 200 mg/dL   CBC w/Differential [[id number]]  (Abnormal) Collected: 07/10/22  1804   Specimen: Whole Blood Updated: 07/10/22 1811    WBC 9.5 K/uL     nRBC Percent 0 %     nRBC Abs 0.0 K/uL     RBC 3.82* M/uL     Hemoglobin 12.8 g/dL     Hematocrit 63.2 %     MCV 96.1 fL     MCH 33.5* pg     MCHC 34.9 g/dL     RDW-CV 87.8 %     RDW-SD 42.5 fL     Platelets 180 K/uL     MPV 10.2 fL     Neutrophils, % 66.7 %     Lymphocytes, % 28.3 %     Monocytes, % 4.1 %     Eosinophils % 0.1 %     Basophils % 0.3 %     Immature Granulocytes, % 0.5 %     Neutrophils, Abs 6.35* K/uL     Lymphocytes, Abs 2.70 K/uL     Monocytes, Abs 0.39 K/uL     Eosinophils, Abs <0.03* K/uL     Basophils, Abs 0.03 K/uL     Immature Granulocytes, Abs 0.05* K/uL    XR Chest 1 Vw [[id number]] Collected: 07/10/22 1754    Updated: 07/10/22  1757   Narrative:       XR CHEST 1 VW   Indication:;cp;  Comparison: None  Findings: A single portable chest radiograph was obtained. The cardiomediastinal silhouette is normal. There is no evidence of airspace disease, consolidation, pleural effusion or pneumothorax. There are no acute osseous abnormalities.    Impression:      No acute cardiopulmonary disease.  Signed by: 07/10/2022 5:55 PM: Alyson MD, Lorene            EKG: Independent EKG review shows normal sinus rhythm with a rate of 79.  Normal axis.  No acute ST-T wave findings  Independent EKG review shows normal sinus rhythm rate of 65.  Normal axis.  No acute ST-T wave changes.  Medications given in ED Medications during this Stay       Administrations Status Frequency Start Date End Date   aspirin  (BAYER) chewable tablet 324 mg - Active Once 07/10/22 -       Medical Decision Making  57 year old female presents with chest pain.  She does have history of heart disease.  Pressure and sensation washing her car.  Relieved with nitro x 1.  No pain at this time.  She is visiting from out of town.  EKG is unremarkable.  Will give aspirin  as she only took a baby aspirin  today.  Will order labs, chest x-ray.  ED Course ED Course as of 07/10/22 2156  Fri Jul 10, 2022  2154 Troponin unremarkable x 2.  D-dimer unremarkable.  Unlikely to be ACS.  There is no acute EKG findings.  Will discharge as patient is had no further pain at this time. [BM]    ED Course User Index [BM] Messer, Donnel Fitzpatrick, MD      Clinical Impressions as of 07/10/22 2156  Chest pain, unspecified type     Impression   ICD-9-CM ICD-10-CM   1. Chest pain, unspecified type  786.50 R07.9        Dispo Discharge  Patient will be discharged from the hospital at this time. Patient understands evaluation of their condition in ED.  Instructions given for when to return to ED and the patient voiced understanding.   Reviewed warning signs and  symptoms to return to  ED.   All questions answered and agrees with plan for discharge and follow up.         Nonda Donnel Fitzpatrick, MD 07/10/22 2156  *Some images could not be shown.

## 2022-07-10 NOTE — Telephone Encounter (Signed)
Called patient and she reported that she has been having chest pain every few weeks for the past couple of months. When she has chest pain she takes a couple of nitro's and the pain is resolved. Two weeks ago she had left sided chest pain and she took 3 nitro's and 8 aspirin to relieve the chest pain. Today she had chest pain earlier while she was washing her car and it was relieved by 1 nitro.Her blood pressure today is 152/90 HR 78. She also becomes short of breath and has vomited in the past when when she has chest pain. She currently has no chest pain or discomfort and no symptoms. She also stated that she does not want to go to the ER. Please advise.

## 2022-07-10 NOTE — Telephone Encounter (Signed)
Called patient and informed her of Dr. Kem Parkinson recommendation below:   "She has significant coronary artery disease history.  I reviewed her chart.  She has to go to the emergency room for the symptoms.  This can be signs of an impending heart attack and can be fatal also.  Please let her know."  Patient stated that she would go to the ER. Patient had no further questions at this time.

## 2022-07-10 NOTE — Telephone Encounter (Signed)
Pt c/o of Chest Pain: STAT if CP now or developed within 24 hours  1. Are you having CP right now? No, did happen today   2. Are you experiencing any other symptoms (ex. SOB, nausea, vomiting, sweating)? SOB, vomit (not recently)  3. How long have you been experiencing CP? A couple of months   4. Is your CP continuous or coming and going? Coming and going   5. Have you taken Nitroglycerin? Yes  ?

## 2022-07-29 ENCOUNTER — Other Ambulatory Visit: Payer: Self-pay | Admitting: Gastroenterology

## 2022-09-08 ENCOUNTER — Ambulatory Visit: Payer: BC Managed Care – PPO | Admitting: Cardiology

## 2022-10-01 ENCOUNTER — Other Ambulatory Visit: Payer: Self-pay | Admitting: Cardiology

## 2022-12-01 ENCOUNTER — Other Ambulatory Visit: Payer: Self-pay

## 2023-04-07 ENCOUNTER — Other Ambulatory Visit: Payer: Self-pay | Admitting: Cardiology

## 2023-05-05 ENCOUNTER — Other Ambulatory Visit: Payer: Self-pay | Admitting: Cardiology

## 2023-05-13 NOTE — Progress Notes (Signed)
 [name] Songster, MD [address] [place] [address] Phone: [phone]    Medical Oncology and Hematology Outpatient Progress  Rose [name] 05/13/2023 [birth year] (58 y.o.)    Chief Complaint  Patient presents with  .  Follow-up for follicular lymphoma       Assessment & Plan   Assessment:  This is a 58 year old lady with a history of follicular lymphoma, previously treated with radiation, currently has stable lymph nodes and is on observation. ECOG performance status zero.  The patient has some new night sweats and also some palpable lymph nodes in the neck. We will plan to get a CT neck and we will have her follow up in six months' time. If it remains to be stable, she can then be switched to have annual visits as well. Patient has been last treated in 2016, has had stable lymph nodes and no signs of active disease. At this point, from follicular lymphoma, she can be followed up in the clinic with clinical exam.   In terms of constipation, we have advised her to check TSH and also try other laxatives to help with her bowel movements. Also, in terms of her fatigue, we will check her TSH and see if that she has any thyroid  abnormality that could explain all these symptoms.  PLAN: Patient will be on observation. We will get a CT completed today and return to clinic in six months' time.  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 Patient found to have abdominal lymph nodes, underwent 20 treatments, 32 gray completed in 06/2014. Patient has complete response. Patient appeared to had some sort of stable lymph node in the mesenteric for which she has been monitored. At this point in time, patient has not had recurrence. She has had stable lymph nodes and is here for transfer for care.  Interval history: The patient is here for a follow-up and is doing fairly okay at this point of time. The patient complains of having dizziness, vision changes, occasional night sweats, constipation and fatigue. She also reports having neck swelling for 3 months.    Patient's Current Symptoms   Objective   General: Positive for fatigue and occasional night sweats. Generally healthy, no change in strength or exercise tolerance.   Head: Positive for dizziness. No headaches, no injury.   Eyes: Positive for vision changes. No diplopia, no tearing, no pain.   Ears: No change in hearing, no tinnitus, no bleeding.   Mouth: No dental difficulties, no gingival bleeding, no use of dentures.   Nose: No epistaxis, no obstruction, no discharge.   Neck: Positive for neck swelling. Otherwise, normal exam.    Breast: No noted lumps, no tenderness, no swelling, no nipple discharge.   Chest: No dyspnea, no wheezing, no hemoptysis, no cough.   Heart: No chest pains, no palpitations, no syncope, no orthopnea.  Abdomen: No change in appetite, no dysphagia, no abdominal pains, no emesis, no melena.   GI:  Positive for constipation. Denies nausea, vomiting, diarrhea or loose stools, melena, abdominal pain or cramps.   Gyn: No change in menses, no dysmenorrhea, no vaginal discharge, no pelvic pain. No urinary urgency, no dysuria, no change in nature of urine.  Musculoskeletal: No muscles and joint pain, no limitation of range of motion, no paresthesia or numbness.   Neurologic: No weakness, no tremor, no  seizures, no changes in mentation, no ataxia.   Psychiatric: No depressive symptoms, no changes in sleep habits, no changes in thought content.   Vitals: BP 149/81   Pulse 72   Temp 97.8 F (36.6 C) (Oral)   Resp 18   SpO2 100%   Physical Exam  CONSTITUTIONAL: Well appearing, well nourished, and in no apparent distress.  HENMT: External nose: No masses; External Ears: No masses; Face with no lymph node enlargement.    EYES: no scleral icterus. PERRLA. EOMI.  CARDIOVASCULAR: Normal rate and rhythm. No murmurs, bruits.  RESPIRATORY: Normal respiratory effort, no accessory muscle use. No wheezing.  GASTROINTESTINAL: Non-distended, no guarding. Normal bowel sounds.  MUSCULOSKELETAL: Range of motion is not limited, no masses, no tenderness, no joint deformity, no muscular tenderness.   NEUROLOGICAL: Alert and oriented, no focal deficits, no motor or sensory deficits.   SKIN: Skin normal color for race, warm, dry and intact. No evidence of trauma.  PSYCHIATRIC: Alert and oriented to person, place, time/situation. normal mood and affect. No apparent risk to self or others.  HEME/LYMPH: No cervical lymphadenopathy.   Active Ambulatory Problems    Diagnosis Date Noted  . Atherosclerosis of native coronary artery without angina pectoris 04/13/2022  . Chronic obstructive pulmonary disease (CMS/HCC) 04/13/2022  . Follicular non-Hodgkin's lymphoma of intestine (CMS/HCC) 04/17/2014  . Tobacco use 04/13/2022   Resolved Ambulatory Problems    Diagnosis Date Noted  . No Resolved Ambulatory Problems   Past Medical History:  Diagnosis Date  . COPD (chronic obstructive pulmonary disease) (CMS/HCC)    WBC Count  Date Value Ref Range Status  05/12/2023 10.2 4.0 - 10.5 K/uL Final   HEMOGLOBIN, WHOLE BLOOD  Date Value Ref Range Status  05/12/2023 12.4 11.2 - 15.7 g/dL Final   HEMATOCRIT, WHOLE BLOOD  Date Value Ref Range Status  05/12/2023 38.8 34.1 - 44.9 % Final   PLATELET COUNT  Date  Value Ref Range Status  05/12/2023 183 150 - 450 K/uL Final   Blood Urea Nitrogen Level, Blood (ATL)  Date Value Ref Range Status  05/12/2023 14 6 - 20 mg/dL Final   Creatinine Level, Blood (ATL)  Date Value Ref Range Status  05/12/2023 0.68 0.50 - 0.90 mg/dL Final   SGOT (AST) (ATL)  Date Value Ref Range Status  05/12/2023 14 <=32 U/L Final   SGPT (ALT) (ATL)  Date Value Ref Range Status  05/12/2023 18 <=33 U/L Final   Bilirubin Total, Blood (ATL)  Date Value Ref Range Status  05/12/2023 0.2 <=1.2 mg/dL Final   Sodium Level, Blood (ATL)  Date Value Ref Range Status  05/12/2023 135 (L) 136 - 145 mmol/L Final   Potassium Level, Blood (ATL)  Date Value Ref Range Status  05/12/2023 4.0 3.4 - 4.5 mmol/L Final   Albumin Level, Blood (ATL)  Date Value Ref Range Status  05/12/2023 4.0 3.5 - 5.2 g/dL Final     Medical Imaging Review   CT chest abdomen pelvis with contrast  Result Date:  05/12/2023 Impression:    1. Exam demonstrates infiltration mesentery with calcified mildly enlarged mesenteric lymph nodes as well as shoddy borderline enlarged periaortic and retrocrural /posterior mediastinal chain chain lymph nodes.  Findings likely related to treated lymphoma lymph nodes stable in size.  No new or increasing lymph nodes or masses..  No splenomegaly.  2. Stable sub-6 mm noncalcified pulmonary nodule are noted, primarily pleurally based. No new or increasing pulmonary nodules or masses. No new areas of consolidation. 3. Centrilobular paraseptal emphysema is redemonstrated. Up-to-date CT equipment and radiation dose reduction techniques were employed. CTDIvol: 6.2 - 6.3 mGy. DLP: 558 mGy-cm. Tobi Siva      Note Authored By:   05/13/2023 10:15 AM   All medical record entries made by the Scribe were at my direction and personally dictated by me. I have reviewed the chart and agree that the record accurately reflects my personal performance of the history, physical exam,  discussion and plan.

## 2023-05-13 NOTE — Progress Notes (Signed)
 Patient in clinic for follow up with Dr Annis Hoffmann. Unaccompanied in today's clinic visit.  MD reviewed labs, scans, and patient's medical history.     Plan for CT of neck today due to visualized neck enlargement by MD. Pt to continue self-monitoring for any new/worsening symptoms at home and update team as any new symptoms or concerns arise.   Questions answered, patient verbalized understanding. Return to clinic in 6 months labs, clinic visit, and evaluation. Patient verbalized understanding, no further questions or concerns identified by the patient at this time.

## 2023-06-07 ENCOUNTER — Other Ambulatory Visit: Payer: Self-pay | Admitting: Cardiology

## 2023-06-27 ENCOUNTER — Other Ambulatory Visit: Payer: Self-pay | Admitting: Cardiology

## 2023-07-07 ENCOUNTER — Other Ambulatory Visit: Payer: Self-pay | Admitting: Cardiology

## 2023-07-12 ENCOUNTER — Other Ambulatory Visit: Payer: Self-pay | Admitting: Cardiology

## 2023-07-15 ENCOUNTER — Other Ambulatory Visit: Payer: Self-pay

## 2023-07-15 ENCOUNTER — Telehealth: Payer: Self-pay | Admitting: Cardiology

## 2023-07-15 MED ORDER — EZETIMIBE 10 MG PO TABS: 10.0000 mg | ORAL_TABLET | Freq: Every day | ORAL | 0 refills | Status: DC

## 2023-07-15 NOTE — Telephone Encounter (Signed)
 Called patient and informed her that a re-fill of her Zetia medication had been ordered via Epic and sent to her pharmacy. Patient verbalized understanding and had no further questions at this time.

## 2023-07-15 NOTE — Telephone Encounter (Signed)
*  STAT* If patient is at the pharmacy, call can be transferred to refill team.   1. Which medications need to be refilled? (please list name of each medication and dose if known)   ezetimibe (ZETIA) 10 MG tablet   2. Would you like to learn more about the convenience, safety, & potential cost savings by using the [facility]?   3. Are you open to using the [facility] (Type [facility]. ).  4. Which pharmacy/location (including street and city if local pharmacy) is medication to be sent to?  [id number] - [place], [place] - [address] [address]   5. Do they need a 30 day or 90 day supply?   Patient stated she has 1 tablet left. Patient has appointment scheduled with Dr. Dulce Sellar on 10/07/23.

## 2023-07-24 ENCOUNTER — Other Ambulatory Visit: Payer: Self-pay | Admitting: Cardiology

## 2023-07-26 NOTE — Telephone Encounter (Signed)
 Rx refill sent to pharmacy.

## 2023-07-26 NOTE — Telephone Encounter (Signed)
*  STAT* If patient is at the pharmacy, call can be transferred to refill team.   1. Which medications need to be refilled? (please list name of each medication and dose if known)  new prescription forClopidogrel    2. Would you like to learn more about the convenience, safety, & potential cost savings by using the [facility]?   3. Are you open to using the [facility] (Type [facility].   4. Which pharmacy/location (including street and city if local pharmacy) is medication to be sent to? [facility] [address] [place], [place]   5. Do they need a 30 day or 90 day supply? Need enough until her appointment on 10-06-23 please call today- out of medicine

## 2023-09-16 ENCOUNTER — Telehealth: Payer: Self-pay

## 2023-09-16 NOTE — Telephone Encounter (Signed)
 I s/w Rose [name] with [employer]. I informed Rose [name] the pt has been cleared and clearance faxed this morning from Slater Duncan, NP at 8:44 am. I did confirm the fax #.   I assured Rose [name] that I will re-fax notes. Rose [name] thanked me for my help.

## 2023-09-16 NOTE — Telephone Encounter (Signed)
   Pre-operative Risk Assessment    Patient Name: Rose [name]  DOB: Mar 04, 1966 MRN: [id number]   Date of last office visit: 04/15/22 Date of next office visit: 10/07/23  Request for Surgical Clearance    Procedure:  Dental Extraction - Amount of Teeth to be Pulled:  1  Date of Surgery:  Clearance TBD                                Surgeon:   Surgeon's Group or Practice Name:  [facility] Phone number:  [phone] Fax number:  [phone]   Type of Clearance Requested:   - Pharmacy:  Hold Clopidogrel  (Plavix ) please advice   Type of Anesthesia:  Not Indicated   Additional requests/questions:  Does this patient need antibiotics? Would like fax back in writing  Signed, Bascom Lily   09/16/2023, 8:26 AM

## 2023-09-16 NOTE — Telephone Encounter (Signed)
   Primary Cardiologist: Zoe Hinds, MD  Chart reviewed as part of pre-operative protocol coverage. Simple dental extractions (1-2 teeth) are considered low risk procedures per guidelines and generally do not require any specific cardiac clearance. It is also generally accepted that for simple extractions and dental cleanings, there is no need to interrupt blood thinner therapy.   SBE prophylaxis is not required for the patient.  I will route this recommendation to the requesting party via Epic fax function and remove from pre-op pool.  Please call with questions.  Gerldine Koch, NP-C 09/16/2023, 8:43 AM [address] [place], [place] [address] Office [phone] Fax [phone]

## 2023-09-16 NOTE — Telephone Encounter (Signed)
 Odilia Bennett from [employer] is requesting a callback at [phone] regarding pt being clearance or not for procedure. She stated pt is in a lot of pain and has called the office 6 times due to pain and checking the status of them receiving the clearance back for her to be seen. Please advise

## 2023-10-07 ENCOUNTER — Ambulatory Visit: Admitting: Cardiology

## 2023-10-07 ENCOUNTER — Ambulatory Visit

## 2023-10-07 VITALS — BP 116/68 | HR 74 | Ht 65.0 in | Wt 153.4 lb

## 2023-10-07 DIAGNOSIS — I25119 Atherosclerotic heart disease of native coronary artery with unspecified angina pectoris: Secondary | ICD-10-CM

## 2023-10-07 DIAGNOSIS — F17200 Nicotine dependence, unspecified, uncomplicated: Secondary | ICD-10-CM | POA: Diagnosis not present

## 2023-10-07 DIAGNOSIS — E7800 Pure hypercholesterolemia, unspecified: Secondary | ICD-10-CM | POA: Diagnosis not present

## 2023-10-07 MED ORDER — CLOPIDOGREL BISULFATE 75 MG PO TABS: 75.0000 mg | ORAL_TABLET | Freq: Every day | ORAL | 3 refills | Status: AC

## 2023-10-07 MED ORDER — EZETIMIBE 10 MG PO TABS: 10.0000 mg | ORAL_TABLET | Freq: Every day | ORAL | 3 refills | Status: AC

## 2023-10-07 NOTE — Assessment & Plan Note (Signed)
 Continues to smoke up to half pack a day.  Mentions she has been able to cut down gradually. Did not get much help with nicotine patch, gum or Chantix in the past.

## 2023-10-07 NOTE — Assessment & Plan Note (Signed)
 Last lipid panel to review is from 09/13/2021 with total cholesterol 160, HDL 46, LDL 83, triglycerides 185. Rose [name]  Continue with atorvastatin  40 mg once daily and Zetia  10 mg once daily, refill for Zetia  sent.

## 2023-10-07 NOTE — Progress Notes (Signed)
 Cardiology Consultation:    Date:  10/07/2023   ID:  Rose [name], DOB 01/11/1966, MRN [id number]  PCP:  Jama Chow, MD  Cardiologist:  Alean SAUNDERS Noriah Osgood, MD   Referring MD: Jama Chow, MD   No chief complaint on file.    ASSESSMENT AND PLAN:   [name] 58 year old woman with history of CAD and prior PCI of proximal LAD July 2019 last stress test with nuclear imaging July 2021 showed no ischemia., , hyperlipidemia, non-Hodgkin's lymphoma s/p radiation, COPD.   Problem List Items Addressed This Visit     Hyperlipidemia - Primary   Last lipid panel to review is from 09/13/2021 with total cholesterol 160, HDL 46, LDL 83, triglycerides 185. SABRA  Continue with atorvastatin  40 mg once daily and Zetia  10 mg once daily, refill for Zetia  sent.       Relevant Medications   ezetimibe  (ZETIA ) 10 MG tablet   Other Relevant Orders   EKG 12-Lead (Completed)   SMOKER (Chronic)   Continues to smoke up to half pack a day.  Mentions she has been able to cut down gradually. Did not get much help with nicotine patch, gum or Chantix in the past.       Coronary artery disease involving native coronary artery of native heart with angina pectoris [facility])   PCI of LAD July 2019. Stress test nuclear imaging July 2021 no ischemia.  Good functional capacity. Asymptomatic.  Continue with single antiplatelet therapy and she prefers to continue clopidogrel  75 mg once daily.  On atorvastatin  40 mg once daily. No anginal symptoms. Has sublingual nitroglycerin  to use as needed. Did not tolerate long-acting nitrate Imdur  30 mg due to headaches.       Relevant Medications   ezetimibe  (ZETIA ) 10 MG tablet   Other Relevant Orders   EKG 12-Lead (Completed)   Return to clinic in 1 year or as needed.   History of Present Illness:    Rose [name] is a 58 y.o. female who is being seen today for follow-up visit. PCP is Jama Chow, MD. Last visit with us  in the office was with Dr.  Monetta 04-15-2022.  Has history of CAD and prior PCI of proximal LAD July 2019 last stress test with nuclear imaging July 2021 showed no ischemia., , hyperlipidemia, non-Hodgkin's lymphoma s/p radiation, COPD.  She had 1 episode of chest pain related visit to the ER back in April 2024, troponins were unremarkable and she was discharged home No further symptoms.  Pleasant woman here for the visit by herself.  Lives at home with her husband.  They have a dog Rose [name] that she walks regularly.  Her husband travels for work and they go out in [employer] operational [employer].  Mentions she stays busy at home and with their travels and walks regularly.  No active symptoms.  Has been doing well.  Good compliance with her medications.  Continues to take both aspirin  and Plavix  at this time and tends to bruises when she bumps into objects. Did not tolerate Imdur  due to constant headaches.  Continues to smoke cigarettes down to half pack a day. Does not drink alcohol.  No other substance abuse.  EKG in the clinic today shows sinus rhythm heart rate 74/min, PR interval 150 ms, QRS duration 90 ms, QTc 441 ms.  No ischemic changes.  Blood work from 05/12/2023 shows CMP with sodium 135, potassium 4 BUN 14, creatinine 0.68 and EGFR greater than 60. Normal transaminases and alkaline phosphatase  Thyroid -stimulating hormone normal 0.63 CBC hemoglobin 12.5 hematocrit 38.8, WBC 10.2 and platelets 183   Past Medical History:  Diagnosis Date   Adult BMI <19 kg/sq m 06/22/2017   ALLERGIC RHINITIS 11/05/2009   Qualifier: Diagnosis of  By: Waylan ROSALEA Pao     Anxiety 06/22/2017   Arteritis (HCC) 06/22/2017   Arteritis (HCC)    Chronic bronchitis (HCC)    Chronic idiopathic constipation 09/05/2012   Chronic nausea 06/22/2017   CHRONIC OBSTRUCTIVE PULMONARY DISEASE, ACUTE EXACERBATION 10/17/2009   Qualifier: Diagnosis of  By: Waylan ROSALEA Pao     COMMON MIGRAINE 08/17/2008   Qualifier: Diagnosis of  By: Waylan DO, Karen     COPD  05/24/2007   Qualifier: Diagnosis of  By: Waylan DO, Karen     COPD GOLD II 05/24/2007   Refuses any inhalers with black box warning of sudden cardiac death - spirometry 08-Jul-2013 FEV1  2.07 ( 74%) ratio 69 so GOLD II barely but still smoking      Coronary artery disease    Cough 06/10/2013   Followed in Pulmonary clinic/ [facility]/ [facility]     DEPRESSION 05/24/2007   Qualifier: Diagnosis of  By: Waylan ROSALEA Pao     Depression 05/24/2007   Dysuria 01/06/2010   Qualifier: Diagnosis of  By: Waylan ROSALEA Pao     FATIGUE 01/06/2010   Qualifier: Diagnosis of  By: Waylan DO, Karen     GERD without esophagitis 06/22/2017   Hemoptysis 06/10/2013   Followed in Pulmonary clinic/ [facility]/ [facility]     History of hiatal hernia    History of kidney stones    Hyperlipidemia LDL goal < 160 09-Jul-2007   [place] Risk 3% Dec 2013     Hypertension, benign 04/18/2007   Impaired fasting glucose 09/17/2009   Qualifier: Diagnosis of  By: Mahlon MD, Comer     Migraine    take RX qd; still have ~ 1/wk (06/30/2017)   Non-Hodgkin lymphoma (HCC) dx'd 03/2014   OSA on CPAP    OSA on CPAP 02/26/2014   CPAP titration to 13 CWP, AHI 0 per hour. 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 Small internal  hemrrhoids. Monir degree of rectal prolapse   COLONOSCOPY  06/19/2019   CORONARY ANGIOPLASTY WITH STENT PLACEMENT  06/30/2017   CORONARY STENT INTERVENTION N/A 06/30/2017   Procedure: CORONARY STENT INTERVENTION;  Surgeon: Verlin Lonni BIRCH, MD;  Location: [facility] INVASIVE [facility];  Service: Cardiovascular;  Laterality: N/A;   DILATION AND CURETTAGE OF UTERUS     ELBOW FRACTURE SURGERY Left    ESOPHAGOGASTRODUODENOSCOPY     Dr Floydene   FRACTURE SURGERY  INCONTINENCE SURGERY  2004   laparoscopy and mesenteric and omental biopsies  03/16/2014   Dr Gerard   LEFT HEART CATH AND CORONARY ANGIOGRAPHY N/A 06/30/2017   Procedure: LEFT HEART CATH AND CORONARY ANGIOGRAPHY;  Surgeon: Verlin Lonni BIRCH, MD;  Location: [facility] INVASIVE CV LAB;  Service: Cardiovascular;  Laterality: N/A;   MOHS SURGERY     PLACEMENT OF BREAST IMPLANTS  1998   TUBAL LIGATION  1988   UPPER GASTROINTESTINAL ENDOSCOPY  2021   also had one previously   VAGINAL HYSTERECTOMY  2004    Current Medications: Current Meds  Medication Sig   albuterol  (PROVENTIL  HFA;VENTOLIN  HFA) 108 (90 BASE) MCG/ACT inhaler Inhale two puffs every 4-6 hours only as needed for shortness of breath or wheezing.   albuterol  (PROVENTIL ) (2.5 MG/3ML) 0.083% nebulizer solution Take 3 mLs (2.5 mg total) by nebulization every 6 (six) hours as needed for wheezing.   ALPRAZolam  (XANAX ) 0.5 MG tablet Take 0.5 mg by mouth 4 (four) times daily as needed for anxiety.   AMBULATORY NON FORMULARY MEDICATION Cpap titration and mask fitting Dx sleep apnea   aspirin  EC 81 MG tablet Take 81 mg by mouth daily.   atorvastatin  (LIPITOR) 40 MG tablet Take 1 tablet (40 mg total) by mouth daily at 6 PM.   Carboxymethylcellul-Glycerin (REFRESH OPTIVE OP) Place 2 drops into both eyes as needed (dry eyes).   HYDROcodone-acetaminophen  (NORCO/VICODIN) 5-325 MG tablet Take 1 tablet by mouth every 6 (six) hours as needed for migraine.   isosorbide  mononitrate (IMDUR )  30 MG 24 hr tablet Take 1 tablet (30 mg total) by mouth daily.   LINZESS  72 MCG capsule Take 72 mcg by mouth daily as needed (constipation).   metFORMIN (GLUCOPHAGE) 850 MG tablet Take 850 mg by mouth at bedtime.   nitroGLYCERIN  (NITROSTAT ) 0.4 MG SL tablet Place 1 tablet (0.4 mg total) under the tongue every 5 (five) minutes as needed for chest pain.   pantoprazole  (PROTONIX ) 40 MG tablet TAKE 1 TABLET(40 MG) BY MOUTH DAILY   predniSONE  (DELTASONE ) 5 MG tablet Take 5 mg by mouth daily.    SPIRIVA  RESPIMAT 2.5 MCG/ACT AERS Take 1 puff by mouth daily.   valACYclovir  (VALTREX ) 1000 MG tablet Take 1,000 mg by mouth as needed (cold sores).   [DISCONTINUED] clopidogrel  (PLAVIX ) 75 MG tablet Take 1 tablet (75 mg total) by mouth daily.   [DISCONTINUED] ezetimibe  (ZETIA ) 10 MG tablet Take 1 tablet (10 mg total) by mouth daily. Patient needs appointment for further refills. 3 rd//final attempt   Current Facility-Administered Medications for the 10/07/23 encounter (Office Visit) with Kathya Wilz, Alean SAUNDERS, MD  Medication   0.9 %  sodium chloride  infusion     Allergies:   Naproxen   Social History   Socioeconomic History   Marital status: Married    Spouse name: Not on file   Number of children: Not on file   Years of education: Not on file   Highest education level: Not on file  Occupational History   Occupation: Therapist, sports: [employer]  Tobacco Use   Smoking status: Every Day    Current packs/day: 0.50    Average packs/day: 0.5 packs/day for 41.0 years (20.5 ttl pk-yrs)    Types: Cigarettes   Smokeless tobacco: Never   Tobacco comments:    using vape. Does 5 cigs a day   Vaping Use   Vaping status: Former   Substances: Nicotine  Substance and Sexual Activity   Alcohol use: Not Currently  Drug use: Never   Sexual activity: Not on file  Other Topics Concern   Not on file  Social History Narrative   Not on file   Social Drivers of Health   Financial Resource  Strain: Not on file  Food Insecurity: Not on file  Transportation Needs: Not on file  Physical Activity: Not on file  Stress: Not on file  Social Connections: Unknown (07/10/2022)   Received [facility] [address]   Social Connections    Frequency of Communication with Friends and Family: Not asked    Frequency of Social Gatherings with Friends and Family: Not asked     Family History: The patient's family history includes Anemia in her mother; Breast cancer in her mother; Colon polyps in her maternal grandmother; Heart attack in her brother, father, maternal grandfather, paternal grandfather, and sister; Heart disease in her brother, father, maternal grandfather, paternal grandfather, and sister; Hyperlipidemia in her brother, father, maternal grandfather, maternal grandmother, mother, paternal grandfather, paternal grandmother, and sister; Hypertension in her brother, father, maternal grandfather, maternal grandmother, mother, paternal grandfather, paternal grandmother, and sister; Prostate cancer in her brother and father; Skin cancer in her father; Stomach cancer in her paternal grandmother; Stroke in her maternal grandfather. There is no history of Colon cancer, Esophageal cancer, or Rectal cancer. ROS:   Please see the history of present illness.    All 14 point review of systems negative except as described per history of present illness.  EKGs/Labs/Other Studies Reviewed:    The following studies were reviewed today:   EKG:  EKG Interpretation Date/Time:  Thursday October 07 2023 10:19:44 EDT Ventricular Rate:  74 PR Interval:  150 QRS Duration:  90 QT Interval:  398 QTC Calculation: 441 R Axis:   59  Text Interpretation: Normal sinus rhythm Normal ECG When compared with ECG of 01-Jul-2017 06:41, No significant change was found Confirmed by Liborio Hai reddy [phone]) on 10/07/2023 11:08:37 AM    Recent Labs: No results found for requested labs within last 365 days.  Recent  Lipid Panel    Component Value Date/Time   CHOL 141 11/15/2019 0851   TRIG 88 11/15/2019 0851   HDL 68 11/15/2019 0851   CHOLHDL 2.1 11/15/2019 0851   CHOLHDL 3.6 03/22/2012 1038   VLDL 32 03/22/2012 1038   LDLCALC 56 11/15/2019 0851    Physical Exam:    VS:  BP 116/68 (BP Location: Right Arm, Patient Position: Sitting)   Pulse 74   Ht 5' 5 (1.651 m)   Wt 153 lb 6.4 oz (69.6 kg)   SpO2 96%   BMI 25.53 kg/m     Wt Readings from Last 3 Encounters:  10/07/23 153 lb 6.4 oz (69.6 kg)  04/15/22 167 lb (75.8 kg)  04/25/21 161 lb (73 kg)     GENERAL:  Well nourished, well developed in no acute distress NECK: No JVD; No carotid bruits CARDIAC: RRR, S1 and S2 present, no murmurs, no rubs, no gallops CHEST:  Clear to auscultation without rales, wheezing or rhonchi  Extremities: No pitting pedal edema. Pulses bilaterally symmetric with radial 2+ and dorsalis pedis 2+ NEUROLOGIC:  Alert and oriented x 3  Medication Adjustments/Labs and Tests Ordered: Current medicines are reviewed at length with the patient today.  Concerns regarding medicines are outlined above.  Orders Placed This Encounter  Procedures   EKG 12-Lead   Meds ordered this encounter  Medications   clopidogrel  (PLAVIX ) 75 MG tablet    Sig: Take 1 tablet (75  mg total) by mouth daily.    Dispense:  90 tablet    Refill:  3   ezetimibe  (ZETIA ) 10 MG tablet    Sig: Take 1 tablet (10 mg total) by mouth daily.    Dispense:  90 tablet    Refill:  3    Signed, Samari Gorby reddy Laree Garron, MD, MPH, [facility]. 10/07/2023 11:29 AM    [facility]

## 2023-10-07 NOTE — Patient Instructions (Signed)
Medication Instructions:  Your physician recommends that you continue on your current medications as directed. Please refer to the Current Medication list given to you today.  *If you need a refill on your cardiac medications before your next appointment, please call your pharmacy*   Lab Work: None ordered If you have labs (blood work) drawn today and your tests are completely normal, you will receive your results only by: MyChart Message (if you have MyChart) OR A paper copy in the mail If you have any lab test that is abnormal or we need to change your treatment, we will call you to review the results.   Testing/Procedures: None ordered   Follow-Up: At [facility], you and your health needs are our priority.  As part of our continuing mission to provide you with exceptional heart care, we have created designated Provider Care Teams.  These Care Teams include your primary Cardiologist (physician) and Advanced Practice Providers (APPs -  Physician Assistants and Nurse Practitioners) who all work together to provide you with the care you need, when you need it.  We recommend signing up for the patient portal called "MyChart".  Sign up information is provided on this After Visit Summary.  MyChart is used to connect with patients for Virtual Visits (Telemedicine).  Patients are able to view lab/test results, encounter notes, upcoming appointments, etc.  Non-urgent messages can be sent to your provider as well.   To learn more about what you can do with MyChart, go to [email or web address].    Your next appointment:   12 month(s)  The format for your next appointment:   In Person  Provider:   Huntley Dec, MD    Other Instructions none  Important Information About Sugar

## 2023-10-07 NOTE — Assessment & Plan Note (Signed)
 PCI of LAD July 2019. Stress test nuclear imaging July 2021 no ischemia.  Good functional capacity. Asymptomatic.  Continue with single antiplatelet therapy and she prefers to continue clopidogrel  75 mg once daily.  On atorvastatin  40 mg once daily. No anginal symptoms. Has sublingual nitroglycerin  to use as needed. Did not tolerate long-acting nitrate Imdur  30 mg due to headaches.
